# Patient Record
Sex: Female | Born: 1970 | Race: White | Hispanic: No | Marital: Married | Smoking: Never smoker
Health system: Southern US, Community
[De-identification: ages and names within clinical notes are randomized; demographics above are authoritative.]

## PROBLEM LIST (undated history)

## (undated) DIAGNOSIS — E669 Obesity, unspecified: Secondary | ICD-10-CM

## (undated) HISTORY — PX: TONSILLECTOMY: SUR1361

---

## 2007-07-06 ENCOUNTER — Emergency Department: Payer: Self-pay | Admitting: Emergency Medicine

## 2012-02-14 ENCOUNTER — Ambulatory Visit: Payer: Self-pay | Admitting: Family Medicine

## 2013-05-18 ENCOUNTER — Emergency Department: Payer: Self-pay | Admitting: Emergency Medicine

## 2013-05-18 LAB — COMPREHENSIVE METABOLIC PANEL
Albumin: 3.5 g/dL (ref 3.4–5.0)
Alkaline Phosphatase: 111 U/L (ref 50–136)
Anion Gap: 7 (ref 7–16)
Bilirubin,Total: 0.4 mg/dL (ref 0.2–1.0)
Calcium, Total: 8.6 mg/dL (ref 8.5–10.1)
Chloride: 110 mmol/L — ABNORMAL HIGH (ref 98–107)
Co2: 22 mmol/L (ref 21–32)
EGFR (Non-African Amer.): >60
SGPT (ALT): 26 U/L (ref 12–78)
Sodium: 139 mmol/L (ref 136–145)

## 2013-05-18 LAB — URINALYSIS, COMPLETE
Bacteria: NONE SEEN
Glucose,UR: NEGATIVE mg/dL (ref 0–75)
Leukocyte Esterase: NEGATIVE
Ph: 5 (ref 4.5–8.0)
RBC,UR: <1 /HPF (ref 0–5)

## 2013-05-18 LAB — CBC
HCT: 42.1 % (ref 35.0–47.0)
HGB: 14.1 g/dL (ref 12.0–16.0)
MCH: 28.7 pg (ref 26.0–34.0)
MCHC: 33.6 g/dL (ref 32.0–36.0)
MCV: 85 fL (ref 80–100)
RBC: 4.92 10*6/uL (ref 3.80–5.20)
WBC: 10.4 10*3/uL (ref 3.6–11.0)

## 2013-05-18 LAB — TROPONIN I: Troponin-I: <0.02 ng/mL

## 2022-03-08 ENCOUNTER — Observation Stay
Admission: EM | Admit: 2022-03-08 | Discharge: 2022-03-12 | Disposition: A | Discharge status: Home / Self Care | Payer: Self-pay | Attending: Internal Medicine | Admitting: Internal Medicine

## 2022-03-08 ENCOUNTER — Encounter: Payer: Self-pay | Admitting: Emergency Medicine

## 2022-03-08 ENCOUNTER — Emergency Department: Payer: Self-pay

## 2022-03-08 ENCOUNTER — Other Ambulatory Visit: Payer: Self-pay

## 2022-03-08 DIAGNOSIS — K5080 Crohn's disease of both small and large intestine without complications: Principal | ICD-10-CM | POA: Insufficient documentation

## 2022-03-08 DIAGNOSIS — Z9889 Other specified postprocedural states: Secondary | ICD-10-CM | POA: Insufficient documentation

## 2022-03-08 DIAGNOSIS — K8020 Calculus of gallbladder without cholecystitis without obstruction: Secondary | ICD-10-CM | POA: Diagnosis present

## 2022-03-08 DIAGNOSIS — K8050 Calculus of bile duct without cholangitis or cholecystitis without obstruction: Secondary | ICD-10-CM | POA: Diagnosis present

## 2022-03-08 DIAGNOSIS — Z8719 Personal history of other diseases of the digestive system: Secondary | ICD-10-CM

## 2022-03-08 DIAGNOSIS — R1010 Upper abdominal pain, unspecified: Secondary | ICD-10-CM

## 2022-03-08 HISTORY — DX: Obesity, unspecified: E66.9

## 2022-03-08 HISTORY — DX: Morbid (severe) obesity due to excess calories: E66.01

## 2022-03-08 LAB — BASIC METABOLIC PANEL
Anion gap: 7 (ref 5–15)
BUN: 8 mg/dL (ref 6–20)
CO2: 25 mmol/L (ref 22–32)
Calcium: 9.1 mg/dL (ref 8.9–10.3)
Chloride: 107 mmol/L (ref 98–111)
Creatinine, Ser: 0.79 mg/dL (ref 0.44–1.00)
GFR, Estimated: >60 mL/min (ref >60)
Glucose, Bld: 113 mg/dL — ABNORMAL HIGH (ref 70–99)
Potassium: 3.9 mmol/L (ref 3.5–5.1)
Sodium: 139 mmol/L (ref 135–145)

## 2022-03-08 LAB — HEPATIC FUNCTION PANEL
ALT: 51 U/L — ABNORMAL HIGH (ref 0–44)
AST: 95 U/L — ABNORMAL HIGH (ref 15–41)
Albumin: 4.2 g/dL (ref 3.5–5.0)
Alkaline Phosphatase: 138 U/L — ABNORMAL HIGH (ref 38–126)
Bilirubin, Direct: 0.4 mg/dL — ABNORMAL HIGH (ref 0.0–0.2)
Indirect Bilirubin: 0.5 mg/dL (ref 0.3–0.9)
Total Bilirubin: 0.9 mg/dL (ref 0.3–1.2)
Total Protein: 8.1 g/dL (ref 6.5–8.1)

## 2022-03-08 LAB — CBC
HCT: 43.4 % (ref 36.0–46.0)
Hemoglobin: 13.6 g/dL (ref 12.0–15.0)
MCH: 26.1 pg (ref 26.0–34.0)
MCHC: 31.3 g/dL (ref 30.0–36.0)
MCV: 83.1 fL (ref 80.0–100.0)
Platelets: 335 10*3/uL (ref 150–400)
RBC: 5.22 MIL/uL — ABNORMAL HIGH (ref 3.87–5.11)
RDW: 14.8 % (ref 11.5–15.5)
WBC: 9 10*3/uL (ref 4.0–10.5)
nRBC: 0 % (ref 0.0–0.2)

## 2022-03-08 LAB — TROPONIN I (HIGH SENSITIVITY): Troponin I (High Sensitivity): 4 ng/L (ref <18)

## 2022-03-08 LAB — LIPASE, BLOOD: Lipase: 41 U/L (ref 11–51)

## 2022-03-08 MED ORDER — HYDROMORPHONE HCL 1 MG/ML IJ SOLN
1.0000 mg | Freq: Once | INTRAMUSCULAR | Status: AC
  Administered 2022-03-08: 1 mg via INTRAVENOUS
  Filled 2022-03-08: qty 1

## 2022-03-08 MED ORDER — ONDANSETRON HCL 4 MG/2ML IJ SOLN
4.0000 mg | Freq: Once | INTRAMUSCULAR | Status: AC
  Administered 2022-03-08: 4 mg via INTRAVENOUS
  Filled 2022-03-08: qty 2

## 2022-03-08 MED ORDER — SODIUM CHLORIDE 0.9 % IV BOLUS (SEPSIS)
1000.0000 mL | Freq: Once | INTRAVENOUS | Status: AC
Start: 2022-03-08 — End: 2022-03-09
  Administered 2022-03-08: 1000 mL via INTRAVENOUS

## 2022-03-08 NOTE — ED Triage Notes (Signed)
Midsternal chest pain, onset apx 2 hours ago. Initially thought it may be gas but pain has not gotten any better. Pain is cramping in nature and radiates to back. Pain worsens with deep inspiration.  ?

## 2022-03-08 NOTE — ED Provider Notes (Addendum)
? ?[facility] ?Provider Note ? ? ? Event Date/Time  ? First MD Initiated Contact with Patient 03/08/22 2259   ?  (approximate) ? ? ?History  ? ?Chest Pain ? ? ?HPI ? ?Candice Garcia is a 51 y.o. female with history of obesity who presents to the emergency department with upper abdominal pain that radiates into the right flank that started around 7:30 PM tonight.  States that initially she thought it was gas and took Gas-X and another over-the-counter acid reducing medication without any relief.  She has never had similar symptoms.  She states it makes her feel short of breath because she feels she cannot take a deep breath without causing more pain.  She has nausea without vomiting.  Still has her gallbladder and denies any known history of gallstones.  States that she did not eat dinner tonight but ate half a cheeseburger for lunch. ? ?No history of PE, DVT, exogenous estrogen use, recent fractures, surgery, trauma, hospitalization, prolonged travel or other immobilization. No lower extremity swelling or pain. No calf tenderness. ? ? ? ?History provided by patient and husband. ? ? ? ?Past Medical History:  ?Diagnosis Date  ? Obesity   ? ? ?History reviewed. No pertinent surgical history. ? ?MEDICATIONS:  ?Prior to Admission medications   ?Not on File  ? ? ?Physical Exam  ? ?Triage Vital Signs: ?ED Triage Vitals  ?Enc Vitals Group  ?   BP 03/08/22 2155 138/83  ?   Pulse Rate 03/08/22 2155 85  ?   Resp 03/08/22 2155 19  ?   Temp 03/08/22 2155 98.3 ?F (36.8 ?C)  ?   Temp Source 03/08/22 2155 Oral  ?   SpO2 03/08/22 2155 96 %  ?   Weight 03/08/22 2151 250 lb (113.4 kg)  ?   Height 03/08/22 2151 5' 3"  (1.6 m)  ?   Head Circumference --   ?   Peak Flow --   ?   Pain Score 03/08/22 2151 9  ?   Pain Loc --   ?   Pain Edu? --   ?   Excl. in [place]? --   ? ? ?Most recent vital signs: ?Vitals:  ? 03/09/22 0439 03/09/22 0445  ?BP: 124/90   ?Pulse: 86 94  ?Resp: 13 17  ?Temp:    ?SpO2: 99% 96%   ? ? ?CONSTITUTIONAL: Alert and oriented and responds appropriately to questions.  Appears uncomfortable. ?HEAD: Normocephalic, atraumatic ?EYES: Conjunctivae clear, pupils appear equal, sclera nonicteric ?ENT: normal nose; moist mucous membranes ?NECK: Supple, normal ROM ?CARD: RRR; S1 and S2 appreciated; no murmurs, no clicks, no rubs, no gallops ?RESP: Normal chest excursion without splinting or tachypnea; breath sounds clear and equal bilaterally; no wheezes, no rhonchi, no rales, no hypoxia or respiratory distress, speaking full sentences ?ABD/GI: Normal bowel sounds; non-distended; soft, tender to palpation in the epigastric region and right upper quadrant with voluntary guarding, positive Murphy sign ?BACK: The back appears normal ?EXT: Normal ROM in all joints; no deformity noted, no edema; no cyanosis, no calf tenderness or calf swelling ?SKIN: Normal color for age and race; warm; no rash on exposed skin ?NEURO: Moves all extremities equally, normal speech ?PSYCH: The patient's mood and manner are appropriate. ? ? ?ED Results / Procedures / Treatments  ? ?LABS: ?(all labs ordered are listed, but only abnormal results are displayed) ?Labs Reviewed  ?BASIC METABOLIC PANEL - Abnormal; Notable for the following components:  ?    Result  Value  ? Glucose, Bld 113 (*)   ? All other components within normal limits  ?CBC - Abnormal; Notable for the following components:  ? RBC 5.22 (*)   ? All other components within normal limits  ?HEPATIC FUNCTION PANEL - Abnormal; Notable for the following components:  ? AST 95 (*)   ? ALT 51 (*)   ? Alkaline Phosphatase 138 (*)   ? Bilirubin, Direct 0.4 (*)   ? All other components within normal limits  ?LIPASE, BLOOD  ?TROPONIN I (HIGH SENSITIVITY)  ?TROPONIN I (HIGH SENSITIVITY)  ? ? ? ?EKG: ? EKG Interpretation ? ?Date/Time:  Tuesday March 08 2022 21:53:34 EDT ?Ventricular Rate:  89 ?PR Interval:  182 ?QRS Duration: 80 ?QT Interval:  374 ?QTC Calculation: 455 ?R  Axis:   61 ?Text Interpretation: Normal sinus rhythm Normal ECG When compared with ECG of 18-May-2013 22:33, No significant change was found Confirmed by Pryor Curia [phone]) on 03/08/2022 11:02:50 PM ?  ? ?  ? ? ? ?RADIOLOGY: ?My personal review and interpretation of imaging: Chest x-ray shows no pneumonia, edema.  Right upper quad ultrasound shows cholelithiasis. ? ?I have personally reviewed all radiology reports.   ?DG Chest 2 View ? ?Result Date: 03/08/2022 ?CLINICAL DATA:  Midsternal chest pain for 2 hours EXAM: CHEST - 2 VIEW COMPARISON:  None. FINDINGS: The heart size and mediastinal contours are within normal limits. Both lungs are clear. The visualized skeletal structures are unremarkable. IMPRESSION: No active cardiopulmonary disease. Electronically Signed   By: Randa Ngo M.D.   On: 03/08/2022 22:13  ? ?MR ABDOMEN MRCP WO CONTRAST ? ?Result Date: 03/09/2022 ?CLINICAL DATA:  Cholelithiasis.  Upper abdominal pain. EXAM: MRI ABDOMEN WITHOUT CONTRAST  (INCLUDING MRCP) TECHNIQUE: Multiplanar multisequence MR imaging of the abdomen was performed. Heavily T2-weighted images of the biliary and pancreatic ducts were obtained, and three-dimensional MRCP images were rendered by post processing. COMPARISON:  Ultrasound exam earlier same day FINDINGS: Lower chest: Basilar atelectasis noted bilaterally. Hepatobiliary: No suspicious focal abnormality in the liver on this study without intravenous contrast. Fine detail of liver parenchyma obscured by breathing motion. Multiple gallstones are evident ranging in size from 1-2 mm up to a dominant 17 mm stone. No evidence for gallbladder wall thickening or pericholecystic fluid. No intra or extrahepatic biliary duct dilatation with common bile duct measuring 6 mm diameter in the head of the pancreas. 1-2 mm stone is identified in the distal common bile duct (well seen on coronal haste image 16 of series 3 and coronal thin MIP image 5 of series 15). Pancreas: No focal mass  lesion. No dilatation of the main duct. No intraparenchymal cyst. No peripancreatic edema. Spleen:  No splenomegaly. No focal mass lesion. Adrenals/Urinary Tract: No adrenal nodule or mass. Kidneys unremarkable. Stomach/Bowel: Stomach is unremarkable. No gastric wall thickening. No evidence of outlet obstruction. Duodenum is normally positioned as is the ligament of Treitz. No small bowel or colonic dilatation within the visualized abdomen. Vascular/Lymphatic: No abdominal aortic aneurysm. No abdominal lymphadenopathy. Other:  No intraperitoneal free fluid. Musculoskeletal: No suspicious abnormal marrow signal evident. IMPRESSION: Cholelithiasis with choledocholithiasis. No intra or extrahepatic biliary duct dilatation. Electronically Signed   By: Misty Stanley M.D.   On: 03/09/2022 05:07  ? ?US ABDOMEN LIMITED RUQ (LIVER/GB) ? ?Result Date: 03/09/2022 ?CLINICAL DATA:  Right upper quadrant abdominal pain. EXAM: ULTRASOUND ABDOMEN LIMITED RIGHT UPPER QUADRANT COMPARISON:  None. FINDINGS: Gallbladder: There multiple stones within the gallbladder. No gallbladder wall thickening or pericholecystic fluid. Negative  sonographic Murphy's sign. Common bile duct: Diameter: 3 mm Liver: No focal lesion identified. Within normal limits in parenchymal echogenicity. Portal vein is patent on color Doppler imaging with normal direction of blood flow towards the liver. Other: None. IMPRESSION: Cholelithiasis without sonographic evidence of acute cholecystitis. Electronically Signed   By: Anner Crete M.D.   On: 03/09/2022 02:23   ? ? ?PROCEDURES: ? ?Critical Care performed: No ? ? ?CRITICAL CARE ?Performed by: Cyril Mourning Aashvi Rezabek ? ? ?Total critical care time: 0 minutes ? ?Critical care time was exclusive of separately billable procedures and treating other patients. ? ?Critical care was necessary to treat or prevent imminent or life-threatening deterioration. ? ?Critical care was time spent personally by me on the following activities:  development of treatment plan with patient and/or surrogate as well as nursing, discussions with consultants, evaluation of patient's response to treatment, examination of patient, obtaining history from patient or surrogate,

## 2022-03-09 ENCOUNTER — Encounter: Payer: Self-pay | Admitting: Internal Medicine

## 2022-03-09 ENCOUNTER — Emergency Department: Payer: Self-pay

## 2022-03-09 DIAGNOSIS — Z6841 Body Mass Index (BMI) 40.0 and over, adult: Secondary | ICD-10-CM

## 2022-03-09 DIAGNOSIS — K8020 Calculus of gallbladder without cholecystitis without obstruction: Secondary | ICD-10-CM | POA: Diagnosis present

## 2022-03-09 DIAGNOSIS — K8050 Calculus of bile duct without cholangitis or cholecystitis without obstruction: Secondary | ICD-10-CM | POA: Diagnosis present

## 2022-03-09 DIAGNOSIS — K8070 Calculus of gallbladder and bile duct without cholecystitis without obstruction: Secondary | ICD-10-CM

## 2022-03-09 LAB — APTT: aPTT: 27 seconds (ref 24–36)

## 2022-03-09 LAB — TROPONIN I (HIGH SENSITIVITY): Troponin I (High Sensitivity): 5 ng/L (ref <18)

## 2022-03-09 LAB — PROTIME-INR
INR: 1 (ref 0.8–1.2)
Prothrombin Time: 13.4 seconds (ref 11.4–15.2)

## 2022-03-09 LAB — PREGNANCY, URINE: Preg Test, Ur: NEGATIVE

## 2022-03-09 MED ORDER — IBUPROFEN 400 MG PO TABS
400.0000 mg | ORAL_TABLET | Freq: Four times a day (QID) | ORAL | Status: DC | PRN
Start: 2022-03-09 — End: 2022-03-11
  Administered 2022-03-09 – 2022-03-10 (×3): 400 mg via ORAL
  Filled 2022-03-09 (×3): qty 1

## 2022-03-09 MED ORDER — PANTOPRAZOLE SODIUM 40 MG IV SOLR
40.0000 mg | Freq: Once | INTRAVENOUS | Status: AC
  Administered 2022-03-09: 40 mg via INTRAVENOUS
  Filled 2022-03-09: qty 10

## 2022-03-09 MED ORDER — SODIUM CHLORIDE 0.9 % IV SOLN: INTRAVENOUS | Status: DC

## 2022-03-09 MED ORDER — HYDROMORPHONE HCL 1 MG/ML IJ SOLN
1.0000 mg | Freq: Once | INTRAMUSCULAR | Status: AC
  Administered 2022-03-09: 1 mg via INTRAVENOUS
  Filled 2022-03-09: qty 1

## 2022-03-09 MED ORDER — HYDROMORPHONE HCL 1 MG/ML IJ SOLN
1.0000 mg | INTRAMUSCULAR | Status: DC | PRN
  Administered 2022-03-10: 1 mg via INTRAVENOUS
  Filled 2022-03-09: qty 1

## 2022-03-09 MED ORDER — LACTATED RINGERS IV SOLN: INTRAVENOUS | Status: DC

## 2022-03-09 MED ORDER — MORPHINE SULFATE (PF) 2 MG/ML IV SOLN
2.0000 mg | INTRAVENOUS | Status: DC | PRN
  Administered 2022-03-09 (×2): 2 mg via INTRAVENOUS
  Filled 2022-03-09 (×2): qty 1

## 2022-03-09 MED ORDER — PANTOPRAZOLE SODIUM 40 MG PO TBEC
40.0000 mg | DELAYED_RELEASE_TABLET | Freq: Every day | ORAL | Status: DC
  Administered 2022-03-09 – 2022-03-10 (×2): 40 mg via ORAL
  Filled 2022-03-09 (×3): qty 1

## 2022-03-09 MED ORDER — SODIUM CHLORIDE 0.9 % IV SOLN
2.0000 g | Freq: Every day | INTRAVENOUS | Status: DC
  Administered 2022-03-09 – 2022-03-10 (×2): 2 g via INTRAVENOUS
  Filled 2022-03-09 (×2): qty 20
  Filled 2022-03-09: qty 2

## 2022-03-09 MED ORDER — OXYCODONE HCL 5 MG PO TABS
5.0000 mg | ORAL_TABLET | ORAL | Status: DC | PRN
  Administered 2022-03-09 – 2022-03-11 (×6): 5 mg via ORAL
  Filled 2022-03-09 (×8): qty 1

## 2022-03-09 MED ORDER — ONDANSETRON HCL 4 MG/2ML IJ SOLN
4.0000 mg | Freq: Once | INTRAMUSCULAR | Status: AC
  Administered 2022-03-09: 4 mg via INTRAVENOUS
  Filled 2022-03-09: qty 2

## 2022-03-09 MED ORDER — METOCLOPRAMIDE HCL 5 MG/ML IJ SOLN
10.0000 mg | Freq: Once | INTRAMUSCULAR | Status: AC
  Administered 2022-03-09: 10 mg via INTRAVENOUS
  Filled 2022-03-09: qty 2

## 2022-03-09 MED ORDER — ONDANSETRON HCL 4 MG/2ML IJ SOLN
4.0000 mg | Freq: Four times a day (QID) | INTRAMUSCULAR | Status: DC | PRN
  Administered 2022-03-09 – 2022-03-11 (×5): 4 mg via INTRAVENOUS
  Filled 2022-03-09 (×5): qty 2

## 2022-03-09 NOTE — ED Notes (Signed)
Pt up to bathroom and back to bed; pt c/o nausea, EDP made aware ?

## 2022-03-09 NOTE — Progress Notes (Signed)
Persistent intractable pain > 8 hrs c/w cholecystitis. We will admit. Depending on repeat LFTs may need MRCP. Cholecystectomy during admission pending OR availability ?

## 2022-03-09 NOTE — Consult Note (Signed)
?  ?Jonathon Bellows , MD ?[address] [place], [place], [place], [address] ?[address] [place], [place], [place], [address] ?Phone: [phone]  ?Fax: [phone] ? Consultation ? ?Referring Provider:     Dr Blaine Hamper ?Primary Care Physician:  Pcp, No ?Primary Gastroenterologist:  none          ?Reason for Consultation:     Choledocholithiasis ? ?Date of Admission:  03/08/2022 ?Date of Consultation:  03/09/2022 ?       ? HPI:   ?Candice Garcia is a 51 y.o. female Presented to the ER with upper abdominal pain radiating to the right side since she had her breakfast yesterday.  Still has ongoing pain but better with pain medications.  No similar issue in the past but over the last few days she has been having some issues with nausea.  Family history of gallstones.  No nausea vomiting at this point of time.  Denies any fevers.  On admission AST was 95, ALT 51 alkaline phosphatase 138.  And total bilirubin was normal at 0.9.  Underwent an MRCP that showed cholelithiasis with choledocholithiasis with no intra or extrahepatic biliary dilation.  Ultrasound right upper quadrant demonstrated cholelithiasis without evidence of acute cholecystitis.  I have been consulted for an ERCP.Lipase was not elevated and hemoglobin 13.6 g on admission. ? ?Past Medical History:  ?Diagnosis Date  ? Morbid obesity with body mass index (BMI) of 40.0 to 44.9 in adult [facility])   ? ? ?History reviewed. No pertinent surgical history. ? ?Prior to Admission medications   ?Not on File  ? ? ?History reviewed. No pertinent family history.  ? ?  ? ?Allergies as of 03/08/2022  ? (No Known Allergies)  ? ? ?Review of Systems:    ?All systems reviewed and negative except where noted in HPI. ? ? Physical Exam:  ?Vital signs in last 24 hours: ?Temp:  [98.3 ?F (36.8 ?C)] 98.3 ?F (36.8 ?C) (03/28 2155) ?Pulse Rate:  [74-94] 88 (03/29 0530) ?Resp:  [12-30] 16 (03/29 0530) ?BP: (107-138)/(77-96) 130/85 (03/29 0530) ?SpO2:  [90 %-99 %] 90 % (03/29 0530) ?Weight:   [113.4 kg] 113.4 kg (03/28 2151) ?  ?General:   Pleasant, cooperative in NAD ?Head:  Normocephalic and atraumatic. ?Eyes:   No icterus.   Conjunctiva pink. PERRLA. ?Ears:  Normal auditory acuity. ?Neck:  Supple; no masses or thyroidomegaly ?Lungs: Respirations even and unlabored. Lungs clear to auscultation bilaterally.   No wheezes, crackles, or rhonchi.  ?Heart:  Regular rate and rhythm;  Without murmur, clicks, rubs or gallops ?Abdomen:  Soft, nondistended, mild epigastric and right upper quadrant tenderness.  Normal bowel sounds. No appreciable masses or hepatomegaly.  No rebound or guarding.  ?Neurologic:  Alert and oriented x3;  grossly normal neurologically. ?Psych:  Alert and cooperative. Normal affect. ? ?LAB RESULTS: ?Recent Labs  ?  03/08/22 ?2155  ?WBC 9.0  ?HGB 13.6  ?HCT 43.4  ?PLT 335  ? ?BMET ?Recent Labs  ?  03/08/22 ?2155  ?NA 139  ?K 3.9  ?CL 107  ?CO2 25  ?GLUCOSE 113*  ?BUN 8  ?CREATININE 0.79  ?CALCIUM 9.1  ? ?LFT ?Recent Labs  ?  03/08/22 ?2155  ?PROT 8.1  ?ALBUMIN 4.2  ?AST 95*  ?ALT 51*  ?ALKPHOS 138*  ?BILITOT 0.9  ?BILIDIR 0.4*  ?IBILI 0.5  ? ?PT/INR ?No results for input(s): LABPROT, INR in the last 72 hours. ? ?STUDIES: ?DG Chest 2 View ? ?Result Date: 03/08/2022 ?CLINICAL DATA:  Midsternal  chest pain for 2 hours EXAM: CHEST - 2 VIEW COMPARISON:  None. FINDINGS: The heart size and mediastinal contours are within normal limits. Both lungs are clear. The visualized skeletal structures are unremarkable. IMPRESSION: No active cardiopulmonary disease. Electronically Signed   By: Randa Ngo M.D.   On: 03/08/2022 22:13  ? ?MR ABDOMEN MRCP WO CONTRAST ? ?Result Date: 03/09/2022 ?CLINICAL DATA:  Cholelithiasis.  Upper abdominal pain. EXAM: MRI ABDOMEN WITHOUT CONTRAST  (INCLUDING MRCP) TECHNIQUE: Multiplanar multisequence MR imaging of the abdomen was performed. Heavily T2-weighted images of the biliary and pancreatic ducts were obtained, and three-dimensional MRCP images were rendered by post  processing. COMPARISON:  Ultrasound exam earlier same day FINDINGS: Lower chest: Basilar atelectasis noted bilaterally. Hepatobiliary: No suspicious focal abnormality in the liver on this study without intravenous contrast. Fine detail of liver parenchyma obscured by breathing motion. Multiple gallstones are evident ranging in size from 1-2 mm up to a dominant 17 mm stone. No evidence for gallbladder wall thickening or pericholecystic fluid. No intra or extrahepatic biliary duct dilatation with common bile duct measuring 6 mm diameter in the head of the pancreas. 1-2 mm stone is identified in the distal common bile duct (well seen on coronal haste image 16 of series 3 and coronal thin MIP image 5 of series 15). Pancreas: No focal mass lesion. No dilatation of the main duct. No intraparenchymal cyst. No peripancreatic edema. Spleen:  No splenomegaly. No focal mass lesion. Adrenals/Urinary Tract: No adrenal nodule or mass. Kidneys unremarkable. Stomach/Bowel: Stomach is unremarkable. No gastric wall thickening. No evidence of outlet obstruction. Duodenum is normally positioned as is the ligament of Treitz. No small bowel or colonic dilatation within the visualized abdomen. Vascular/Lymphatic: No abdominal aortic aneurysm. No abdominal lymphadenopathy. Other:  No intraperitoneal free fluid. Musculoskeletal: No suspicious abnormal marrow signal evident. IMPRESSION: Cholelithiasis with choledocholithiasis. No intra or extrahepatic biliary duct dilatation. Electronically Signed   By: Misty Stanley M.D.   On: 03/09/2022 05:07  ? ?US ABDOMEN LIMITED RUQ (LIVER/GB) ? ?Result Date: 03/09/2022 ?CLINICAL DATA:  Right upper quadrant abdominal pain. EXAM: ULTRASOUND ABDOMEN LIMITED RIGHT UPPER QUADRANT COMPARISON:  None. FINDINGS: Gallbladder: There multiple stones within the gallbladder. No gallbladder wall thickening or pericholecystic fluid. Negative sonographic Murphy's sign. Common bile duct: Diameter: 3 mm Liver: No focal  lesion identified. Within normal limits in parenchymal echogenicity. Portal vein is patent on color Doppler imaging with normal direction of blood flow towards the liver. Other: None. IMPRESSION: Cholelithiasis without sonographic evidence of acute cholecystitis. Electronically Signed   By: Anner Crete M.D.   On: 03/09/2022 02:23   ? ? ? Impression / Plan:  ? ?Candice Garcia is a 51 y.o. y/o female comes to the emergency room with biliary colic and was found to have choledocholithiasis on MRCP. ? ?Plan ?1.  We will plan for ERCP tomorrow with Dr.Wohl ? ? ?I have discussed alternative options, risks & benefits,  which include, but are not limited to, bleeding, infection, acute pancreatitis, perforation,respiratory complication & drug reaction.  The patient agrees with this plan & written consent will be obtained.   ? ? ?Thank you for involving me in the care of this patient.   ? ? ? LOS: 0 days  ? ?Jonathon Bellows, MD  03/09/2022, 8:21 AM ? ?   ?

## 2022-03-09 NOTE — ED Notes (Signed)
Patient transported to MRI 

## 2022-03-09 NOTE — Consult Note (Signed)
[facility] ?SURGICAL CONSULTATION NOTE (initial) - cpt: [phone] ? ? ?HISTORY OF PRESENT ILLNESS (HPI):  ?51 y.o. female presented to [facility] ED overnight for abdominal pain. Patient reports that acute onset of RUQ abdominal pain around 1900 last night. This was sharp in nature and radiated to her flank and back. She did not get any relief from this. She reports deep inspiration resulted in exacerbation of the pain. She endorses associated nausea. No fever, chills, cough, CP, SOB, emesis, urinary changes, bowel changes, or juandice. She does not believe she has had any similar pain in the past. Thinks she may have had less severe episodes before but attributed it to mild indigestion. Only previous abdominal surgery is C-section x1. Work up in the ED was relatively reassuring aside from LFT elevation. She did undergo [place] which was concerning for cholelithiasis without gross evidence of cholecystitis. She would ultimately undergo MRCP which was concerning for choledocholithiasis. She was admitted to medicine service.  ? ?Surgery is consulted by emergency medicine physician Dr. Pryor Curia, DO in this context for evaluation and management of choledocholithiasis. ? ?PAST MEDICAL HISTORY (PMH):  ?Past Medical History:  ?Diagnosis Date  ? Obesity   ?  ? ?PAST SURGICAL HISTORY ([place]):  ?History reviewed. No pertinent surgical history.  ? ?MEDICATIONS:  ?Prior to Admission medications   ?Not on File  ?  ? ?ALLERGIES:  ?No Known Allergies  ? ?SOCIAL HISTORY:  ?Social History  ? ?Socioeconomic History  ? Marital status: Married  ?  Spouse name: Not on file  ? Number of children: Not on file  ? Years of education: Not on file  ? Highest education level: Not on file  ?Occupational History  ? Not on file  ?Tobacco Use  ? Smoking status: Not on file  ? Smokeless tobacco: Not on file  ?Substance and Sexual Activity  ? Alcohol use: Not on file  ? Drug use: Not on file  ? Sexual activity: Not on file  ?Other Topics  Concern  ? Not on file  ?Social History Narrative  ? Not on file  ? ?Social Determinants of Health  ? ?Financial Resource Strain: Not on file  ?Food Insecurity: Not on file  ?Transportation Needs: Not on file  ?Physical Activity: Not on file  ?Stress: Not on file  ?Social Connections: Not on file  ?Intimate Partner Violence: Not on file  ?  ? ?FAMILY HISTORY:  ?History reviewed. No pertinent family history.  ? ? ?REVIEW OF SYSTEMS:  ?Review of Systems  ?Constitutional:  Negative for chills and fever.  ?HENT:  Negative for congestion and sore throat.   ?Respiratory:  Negative for cough and shortness of breath.   ?Cardiovascular:  Negative for chest pain and palpitations.  ?Gastrointestinal:  Positive for abdominal pain and nausea. Negative for blood in stool, constipation, diarrhea and vomiting.  ?Genitourinary:  Negative for dysuria and urgency.  ?All other systems reviewed and are negative. ? ?VITAL SIGNS:  ?Temp:  [98.3 ?F (36.8 ?C)] 98.3 ?F (36.8 ?C) (03/28 2155) ?Pulse Rate:  [74-94] 88 (03/29 0530) ?Resp:  [12-30] 16 (03/29 0530) ?BP: (107-138)/(77-96) 130/85 (03/29 0530) ?SpO2:  [90 %-99 %] 90 % (03/29 0530) ?Weight:  [113.4 kg] 113.4 kg (03/28 2151)     Height: 5' 3"  (160 cm) Weight: 113.4 kg BMI (Calculated): 44.3  ? ?INTAKE/OUTPUT:  ?03/28 0701 - 03/29 0700 ?In: 1000 [IV Piggyback:1000] ?Out: -  ? ?PHYSICAL EXAM:  ?Physical Exam ?Vitals and nursing note reviewed. Exam conducted with  a chaperone present.  ?Constitutional:   ?   General: She is not in acute distress. ?   Appearance: Normal appearance. She is obese. She is not ill-appearing.  ?HENT:  ?   Head: Normocephalic and atraumatic.  ?   Mouth/Throat:  ?   Mouth: Mucous membranes are moist.  ?   Pharynx: Oropharynx is clear.  ?Eyes:  ?   General: No scleral icterus. ?   Conjunctiva/sclera: Conjunctivae normal.  ?Cardiovascular:  ?   Rate and Rhythm: Normal rate.  ?   Pulses: Normal pulses.  ?   Heart sounds: No murmur heard. ?Pulmonary:  ?   Effort:  Pulmonary effort is normal. No respiratory distress.  ?   Breath sounds: Normal breath sounds.  ?Abdominal:  ?   General: Abdomen is protuberant. A surgical scar is present. There is no distension.  ?   Palpations: Abdomen is soft.  ?   Tenderness: There is abdominal tenderness in the right upper quadrant and epigastric area. There is no guarding or rebound. Negative signs include Murphy's sign.  ?   Comments: Abdomen is obese, soft, she is mildly tender in epigastrium and RUQ, non-distended, no rebound/guarding. She is without positive Murphy's Sign.   ?Genitourinary: ?   Comments: Deferred ?Musculoskeletal:  ?   Right lower leg: No edema.  ?   Left lower leg: No edema.  ?Skin: ?   General: Skin is warm and dry.  ?   Coloration: Skin is not jaundiced.  ?   Findings: No erythema.  ?Neurological:  ?   General: No focal deficit present.  ?   Mental Status: She is alert and oriented to person, place, and time.  ?Psychiatric:     ?   Mood and Affect: Mood normal.     ?   Behavior: Behavior normal.  ? ? ? ?Labs:  ? ?  Latest Ref Rng & Units 03/08/2022  ?  9:55 PM 05/18/2013  ? 10:28 PM  ?CBC  ?WBC 4.0 - 10.5 K/uL 9.0   10.4    ?Hemoglobin 12.0 - 15.0 g/dL 13.6   14.1    ?Hematocrit 36.0 - 46.0 % 43.4   42.1    ?Platelets 150 - 400 K/uL 335   298    ? ? ?  Latest Ref Rng & Units 03/08/2022  ?  9:55 PM 05/18/2013  ? 10:28 PM  ?CMP  ?Glucose 70 - 99 mg/dL 113   101    ?BUN 6 - 20 mg/dL 8   7    ?Creatinine 0.44 - 1.00 mg/dL 0.79   0.74    ?Sodium 135 - 145 mmol/L 139   139    ?Potassium 3.5 - 5.1 mmol/L 3.9   3.6    ?Chloride 98 - 111 mmol/L 107   110    ?CO2 22 - 32 mmol/L 25   22    ?Calcium 8.9 - 10.3 mg/dL 9.1   8.6    ?Total Protein 6.5 - 8.1 g/dL 8.1   7.4    ?Total Bilirubin 0.3 - 1.2 mg/dL 0.9   0.4    ?Alkaline Phos 38 - 126 U/L 138   111    ?AST 15 - 41 U/L 95   23    ?ALT 0 - 44 U/L 51   26    ? ? ? ?Imaging studies:  ? ?RUQ [place] (03/09/2022) personally reviewed showing significant cholelithiasis, no evidence of  cholecystitis, and radiologist report reviewed below:  ?IMPRESSION: ?Cholelithiasis  without sonographic evidence of acute cholecystitis. ? ? ?MRCP (03/09/2022) personally reviewed which is positive for CBD filling defect consistent with choledocholithiasis, and radiologist report reviewed below:  ?IMPRESSION: ?Cholelithiasis with choledocholithiasis. No intra or extrahepatic ?biliary duct dilatation. ? ? ?Assessment/Plan: (ICD-10's: K80.50) ?51 y.o. female with choledocholithiasis ? ? - Appreciate medicine admission ?- Agree with GI consultation for ERCP  ?- She will benefit from cholecystectomy this admission after ERCP. Anticipate this will be Thursday or Friday with Dr Dahlia Byes pending OR/Anesthesia availability   ? - NPO for potential GI procedure today ?- IVF resuscitation ?- Monitor abdominal examination ?- Pain control prn; antiemetics prn ?  - Monitor LFTs ? - Mobilization as tolerated  ? - Further management per primary service; we will follow  ? ?All of the above findings and recommendations were discussed with the patient, and all of patient's questions were answered to her expressed satisfaction. ? ?Thank you for the opportunity to participate in this patient's care.  ? ?-- ?Edison Simon, PA-C ?[facility] ?03/09/2022, 7:14 AM ?[phone] ?M-F: 7am - 4pm ? ?

## 2022-03-09 NOTE — Progress Notes (Signed)
Admission profile updated. ?

## 2022-03-09 NOTE — H&P (Signed)
?History and Physical  ? ? ?Candice Garcia HYI:[id number] DOB: December 29, 1970 DOA: 03/08/2022 ? ?Referring MD/NP/PA:  ? ?PCP: Pcp, No  ? ?Patient coming from:  The patient is coming from home.  At baseline, pt is independent for most of ADL.       ? ?Chief Complaint: abdominal pain ? ?HPI: Candice Garcia is a 51 y.o. female with medical history significant of obesity with BMI 44.29, who presents with abdominal pain. ? ?Patient states that her abdominal pain started at about 7:30 PM yesterday, which is located in the right upper quadrant and epigastric area, sharp, severe, aggravated by deep breath.  She also has some back pain, does not think this is radiated from abdominal pain.  Patient has nausea, no vomiting or diarrhea.  No fever or chills.  Patient does not have chest pain, cough, shortness of breath.  No symptoms of UTI. ? ? ?Data Reviewed and ED Course: pt was found to have WBC 9.0, troponin level 4 --> 5, lipase 41, liver function (ALP 138, AST 95, ALT 51, total bilirubin 0.4), temperature normal, blood pressure 130/85, heart rate 94, 88, RR 30, 16, oxygen saturation 90-99% on room air.  Chest x-ray negative.  RUQ-ultrasound showed cholelithiasis without signs of cholecystitis.  MRCP showed cholelithiasis with choledocholithiasis without ductal dilation.  Patient is admitted to [facility] bed as inpatient.  Dr. Vicente Males of GI and Dr. Dahlia Byes of surgery are consulted. ? ? ?EKG: I have personally reviewed.  Sinus rhythm, QTc 455, low voltage in lead III/V2.  No ischemic change. ? ? ?Review of Systems:  ? ?General: no fevers, chills, no body weight gain, has fatigue ?HEENT: no blurry vision, hearing changes or sore throat ?Respiratory: no dyspnea, coughing, wheezing ?CV: no chest pain, no palpitations ?GI: has nausea, abdominal pain, no diarrhea, constipation, vomiting,  ?GU: no dysuria, burning on urination, increased urinary frequency, hematuria  ?Ext: no leg edema ?Neuro: no unilateral weakness, numbness, or tingling,  no vision change or hearing loss ?Skin: no rash, no skin tear. ?MSK: No muscle spasm, no deformity, no limitation of range of movement in spin ?Heme: No easy bruising.  ?Travel history: No recent long distant travel. ? ? ?Allergy: No Known Allergies ? ?Past Medical History:  ?Diagnosis Date  ? Morbid obesity with body mass index (BMI) of 40.0 to 44.9 in adult [facility])   ? ? ?Past Surgical History:  ?Procedure Laterality Date  ? CESAREAN SECTION    ? TONSILLECTOMY    ? ? ?Social History:  reports that she has never smoked. She has never used smokeless tobacco. She reports that she does not currently use alcohol. She reports that she does not use drugs. ? ?Family History:  ?Family History  ?Problem Relation Age of Onset  ? Lung cancer Father   ? Heart disease Father   ? Heart disease Brother   ?  ? ?Prior to Admission medications   ?Not on File  ? ? ?Physical Exam: ?Vitals:  ? 03/09/22 0330 03/09/22 0439 03/09/22 0445 03/09/22 0530  ?BP: 136/86 124/90  130/85  ?Pulse: 74 86 94 88  ?Resp: 19 13 17 16   ?Temp:      ?TempSrc:      ?SpO2: 92% 99% 96% 90%  ?Weight:      ?Height:      ? ?General: Not in acute distress ?HEENT: ?      Eyes: PERRL, EOMI, no scleral icterus. ?      ENT: No discharge from the ears  and nose, no pharynx injection, no tonsillar enlargement.  ?      Neck: No JVD, no bruit, no mass felt. ?Heme: No neck lymph node enlargement. ?Cardiac: S1/S2, RRR, No murmurs, No gallops or rubs. ?Respiratory: No rales, wheezing, rhonchi or rubs. ?GI: Soft, nondistended, has tenderness in right upper quadrant, no rebound pain, no organomegaly, BS present. ?GU: No hematuria ?Ext: No pitting leg edema bilaterally. 1+DP/PT pulse bilaterally. ?Musculoskeletal: No joint deformities, No joint redness or warmth, no limitation of ROM in spin. ?Skin: No rashes.  ?Neuro: Alert, oriented X3, cranial nerves II-XII grossly intact, moves all extremities normally. ?Psych: Patient is not psychotic, no suicidal or hemocidal  ideation. ? ?Labs on Admission: I have personally reviewed following labs and imaging studies ? ?CBC: ?Recent Labs  ?Lab 03/08/22 ?2155  ?WBC 9.0  ?HGB 13.6  ?HCT 43.4  ?MCV 83.1  ?PLT 335  ? ?Basic Metabolic Panel: ?Recent Labs  ?Lab 03/08/22 ?2155  ?NA 139  ?K 3.9  ?CL 107  ?CO2 25  ?GLUCOSE 113*  ?BUN 8  ?CREATININE 0.79  ?CALCIUM 9.1  ? ?GFR: ?Estimated Creatinine Clearance: 100.9 mL/min (by C-G formula based on SCr of 0.79 mg/dL). ?Liver Function Tests: ?Recent Labs  ?Lab 03/08/22 ?2155  ?AST 95*  ?ALT 51*  ?ALKPHOS 138*  ?BILITOT 0.9  ?PROT 8.1  ?ALBUMIN 4.2  ? ?Recent Labs  ?Lab 03/08/22 ?2155  ?LIPASE 41  ? ?No results for input(s): AMMONIA in the last 168 hours. ?Coagulation Profile: ?No results for input(s): INR, PROTIME in the last 168 hours. ?Cardiac Enzymes: ?No results for input(s): CKTOTAL, CKMB, CKMBINDEX, TROPONINI in the last 168 hours. ?BNP (last 3 results) ?No results for input(s): PROBNP in the last 8760 hours. ?HbA1C: ?No results for input(s): HGBA1C in the last 72 hours. ?CBG: ?No results for input(s): GLUCAP in the last 168 hours. ?Lipid Profile: ?No results for input(s): CHOL, HDL, LDLCALC, TRIG, CHOLHDL, LDLDIRECT in the last 72 hours. ?Thyroid Function Tests: ?No results for input(s): TSH, T4TOTAL, FREET4, T3FREE, THYROIDAB in the last 72 hours. ?Anemia Panel: ?No results for input(s): VITAMINB12, FOLATE, FERRITIN, TIBC, IRON, RETICCTPCT in the last 72 hours. ?Urine analysis: ?   ?Component Value Date/Time  ? COLORURINE Straw 05/18/2013 2209  ? APPEARANCEUR Clear 05/18/2013 2209  ? LABSPEC 1.011 05/18/2013 2209  ? PHURINE 5.0 05/18/2013 2209  ? GLUCOSEU Negative 05/18/2013 2209  ? HGBUR 1+ 05/18/2013 2209  ? BILIRUBINUR Negative 05/18/2013 2209  ? KETONESUR Negative 05/18/2013 2209  ? PROTEINUR Negative 05/18/2013 2209  ? NITRITE Negative 05/18/2013 2209  ? LEUKOCYTESUR Negative 05/18/2013 2209  ? ?Sepsis Labs: ?@LABRCNTIP (procalcitonin:4,lacticidven:4) ?)No results found for this or any  previous visit (from the past 240 hour(s)).  ? ?Radiological Exams on Admission: ?DG Chest 2 View ? ?Result Date: 03/08/2022 ?CLINICAL DATA:  Midsternal chest pain for 2 hours EXAM: CHEST - 2 VIEW COMPARISON:  None. FINDINGS: The heart size and mediastinal contours are within normal limits. Both lungs are clear. The visualized skeletal structures are unremarkable. IMPRESSION: No active cardiopulmonary disease. Electronically Signed   By: Randa Ngo M.D.   On: 03/08/2022 22:13  ? ?MR ABDOMEN MRCP WO CONTRAST ? ?Result Date: 03/09/2022 ?CLINICAL DATA:  Cholelithiasis.  Upper abdominal pain. EXAM: MRI ABDOMEN WITHOUT CONTRAST  (INCLUDING MRCP) TECHNIQUE: Multiplanar multisequence MR imaging of the abdomen was performed. Heavily T2-weighted images of the biliary and pancreatic ducts were obtained, and three-dimensional MRCP images were rendered by post processing. COMPARISON:  Ultrasound exam earlier same day FINDINGS: Lower  chest: Basilar atelectasis noted bilaterally. Hepatobiliary: No suspicious focal abnormality in the liver on this study without intravenous contrast. Fine detail of liver parenchyma obscured by breathing motion. Multiple gallstones are evident ranging in size from 1-2 mm up to a dominant 17 mm stone. No evidence for gallbladder wall thickening or pericholecystic fluid. No intra or extrahepatic biliary duct dilatation with common bile duct measuring 6 mm diameter in the head of the pancreas. 1-2 mm stone is identified in the distal common bile duct (well seen on coronal haste image 16 of series 3 and coronal thin MIP image 5 of series 15). Pancreas: No focal mass lesion. No dilatation of the main duct. No intraparenchymal cyst. No peripancreatic edema. Spleen:  No splenomegaly. No focal mass lesion. Adrenals/Urinary Tract: No adrenal nodule or mass. Kidneys unremarkable. Stomach/Bowel: Stomach is unremarkable. No gastric wall thickening. No evidence of outlet obstruction. Duodenum is normally  positioned as is the ligament of Treitz. No small bowel or colonic dilatation within the visualized abdomen. Vascular/Lymphatic: No abdominal aortic aneurysm. No abdominal lymphadenopathy. Other:  No intraperitoneal free

## 2022-03-09 NOTE — ED Notes (Signed)
Rn informed bed ?

## 2022-03-09 NOTE — ED Notes (Signed)
Pt given oral swabs due to dry mouth ?

## 2022-03-10 ENCOUNTER — Inpatient Hospital Stay: Payer: Self-pay | Admitting: Registered Nurse

## 2022-03-10 ENCOUNTER — Encounter
Admission: EM | Disposition: A | Discharge status: Home / Self Care | Payer: Self-pay | Source: Home / Self Care | Attending: Emergency Medicine

## 2022-03-10 ENCOUNTER — Inpatient Hospital Stay: Payer: Self-pay

## 2022-03-10 ENCOUNTER — Encounter: Payer: Self-pay | Admitting: Internal Medicine

## 2022-03-10 HISTORY — PX: ENDOSCOPIC RETROGRADE CHOLANGIOPANCREATOGRAPHY (ERCP) WITH PROPOFOL: SHX5810

## 2022-03-10 LAB — COMPREHENSIVE METABOLIC PANEL
ALT: 170 U/L — ABNORMAL HIGH (ref 0–44)
AST: 166 U/L — ABNORMAL HIGH (ref 15–41)
Albumin: 3.3 g/dL — ABNORMAL LOW (ref 3.5–5.0)
Alkaline Phosphatase: 179 U/L — ABNORMAL HIGH (ref 38–126)
Anion gap: 7 (ref 5–15)
BUN: 6 mg/dL (ref 6–20)
CO2: 23 mmol/L (ref 22–32)
Calcium: 8.2 mg/dL — ABNORMAL LOW (ref 8.9–10.3)
Chloride: 108 mmol/L (ref 98–111)
Creatinine, Ser: 0.61 mg/dL (ref 0.44–1.00)
GFR, Estimated: >60 mL/min (ref >60)
Glucose, Bld: 98 mg/dL (ref 70–99)
Potassium: 3.6 mmol/L (ref 3.5–5.1)
Sodium: 138 mmol/L (ref 135–145)
Total Bilirubin: 1.5 mg/dL — ABNORMAL HIGH (ref 0.3–1.2)
Total Protein: 6.4 g/dL — ABNORMAL LOW (ref 6.5–8.1)

## 2022-03-10 LAB — CBC
HCT: 37.2 % (ref 36.0–46.0)
Hemoglobin: 11.9 g/dL — ABNORMAL LOW (ref 12.0–15.0)
MCH: 26.9 pg (ref 26.0–34.0)
MCHC: 32 g/dL (ref 30.0–36.0)
MCV: 84 fL (ref 80.0–100.0)
Platelets: 279 10*3/uL (ref 150–400)
RBC: 4.43 MIL/uL (ref 3.87–5.11)
RDW: 14.9 % (ref 11.5–15.5)
WBC: 6.7 10*3/uL (ref 4.0–10.5)
nRBC: 0 % (ref 0.0–0.2)

## 2022-03-10 LAB — HIV ANTIBODY (ROUTINE TESTING W REFLEX): HIV Screen 4th Generation wRfx: NONREACTIVE

## 2022-03-10 LAB — GLUCOSE, CAPILLARY: Glucose-Capillary: 93 mg/dL (ref 70–99)

## 2022-03-10 SURGERY — ENDOSCOPIC RETROGRADE CHOLANGIOPANCREATOGRAPHY (ERCP) WITH PROPOFOL
Anesthesia: General

## 2022-03-10 MED ORDER — PROPOFOL 500 MG/50ML IV EMUL
INTRAVENOUS | Status: DC | PRN
  Administered 2022-03-10: 75 ug/kg/min via INTRAVENOUS

## 2022-03-10 MED ORDER — SUGAMMADEX SODIUM 500 MG/5ML IV SOLN
INTRAVENOUS | Status: AC
  Filled 2022-03-10: qty 5

## 2022-03-10 MED ORDER — ONDANSETRON HCL 4 MG/2ML IJ SOLN
INTRAMUSCULAR | Status: AC
Start: 2022-03-10 — End: ?
  Filled 2022-03-10: qty 2

## 2022-03-10 MED ORDER — SUCCINYLCHOLINE CHLORIDE 200 MG/10ML IV SOSY
PREFILLED_SYRINGE | INTRAVENOUS | Status: DC | PRN
  Administered 2022-03-10: 50 mg via INTRAVENOUS

## 2022-03-10 MED ORDER — FENTANYL CITRATE (PF) 100 MCG/2ML IJ SOLN
INTRAMUSCULAR | Status: DC | PRN
Start: 2022-03-10 — End: 2022-03-10
  Administered 2022-03-10 (×2): 50 ug via INTRAVENOUS

## 2022-03-10 MED ORDER — DEXAMETHASONE SODIUM PHOSPHATE 10 MG/ML IJ SOLN
INTRAMUSCULAR | Status: AC
Start: 2022-03-10 — End: ?
  Filled 2022-03-10: qty 1

## 2022-03-10 MED ORDER — MIDAZOLAM HCL 2 MG/2ML IJ SOLN
INTRAMUSCULAR | Status: DC | PRN
  Administered 2022-03-10: 2 mg via INTRAVENOUS

## 2022-03-10 MED ORDER — INDOMETHACIN 50 MG RE SUPP
RECTAL | Status: DC | PRN
  Administered 2022-03-10: 100 mg via RECTAL

## 2022-03-10 MED ORDER — ONDANSETRON HCL 4 MG/2ML IJ SOLN
INTRAMUSCULAR | Status: DC | PRN
  Administered 2022-03-10: 4 mg via INTRAVENOUS

## 2022-03-10 MED ORDER — SUCCINYLCHOLINE CHLORIDE 200 MG/10ML IV SOSY
PREFILLED_SYRINGE | INTRAVENOUS | Status: AC
  Filled 2022-03-10: qty 10

## 2022-03-10 MED ORDER — INDOMETHACIN 50 MG RE SUPP
RECTAL | Status: AC
  Filled 2022-03-10: qty 2

## 2022-03-10 MED ORDER — FENTANYL CITRATE (PF) 100 MCG/2ML IJ SOLN
INTRAMUSCULAR | Status: AC
  Filled 2022-03-10: qty 2

## 2022-03-10 MED ORDER — ROCURONIUM BROMIDE 10 MG/ML (PF) SYRINGE
PREFILLED_SYRINGE | INTRAVENOUS | Status: AC
  Filled 2022-03-10: qty 10

## 2022-03-10 MED ORDER — DEXAMETHASONE SODIUM PHOSPHATE 10 MG/ML IJ SOLN
INTRAMUSCULAR | Status: DC | PRN
Start: 2022-03-10 — End: 2022-03-10
  Administered 2022-03-10: 5 mg via INTRAVENOUS

## 2022-03-10 MED ORDER — INDOMETHACIN 50 MG RE SUPP: 100.0000 mg | Freq: Once | RECTAL | Status: DC

## 2022-03-10 MED ORDER — ATROPINE SULFATE 1 MG/ML IV SOLN
INTRAVENOUS | Status: DC | PRN
  Administered 2022-03-10: .3 mg via INTRAVENOUS

## 2022-03-10 MED ORDER — PROPOFOL 10 MG/ML IV BOLUS
INTRAVENOUS | Status: DC | PRN
  Administered 2022-03-10: 150 mg via INTRAVENOUS

## 2022-03-10 MED ORDER — LACTATED RINGERS IV SOLN: INTRAVENOUS | Status: DC

## 2022-03-10 MED ORDER — INDOMETHACIN 50 MG RE SUPP
100.0000 mg | Freq: Once | RECTAL | Status: DC
  Filled 2022-03-10: qty 2

## 2022-03-10 MED ORDER — MIDAZOLAM HCL 2 MG/2ML IJ SOLN
INTRAMUSCULAR | Status: AC
  Filled 2022-03-10: qty 2

## 2022-03-10 NOTE — Transfer of Care (Signed)
Immediate Anesthesia Transfer of Care Note ? ?Patient: Candice Garcia ? ?Procedure(s) Performed: ENDOSCOPIC RETROGRADE CHOLANGIOPANCREATOGRAPHY (ERCP) WITH PROPOFOL ? ?Patient Location: Endoscopy Unit ? ?Anesthesia Type:General ? ?Level of Consciousness: drowsy ? ?Airway & Oxygen Therapy: Patient Spontanous Breathing and Patient connected to face mask oxygen ? ?Post-op Assessment: Report given to RN and Post -op Vital signs reviewed and stable ? ?Post vital signs: Reviewed and stable ? ?Last Vitals:  ?Vitals Value Taken Time  ?BP 146/75 03/10/22 1701  ?Temp 36.6 ?C 03/10/22 1700  ?Pulse 99 03/10/22 1705  ?Resp 18 03/10/22 1705  ?SpO2 91 % 03/10/22 1705  ?Vitals shown include unvalidated device data. ? ?Last Pain:  ?Vitals:  ? 03/10/22 1700  ?TempSrc: Temporal  ?PainSc: 0-No pain  ?   ? ?Patients Stated Pain Goal: 0 (03/10/22 1010) ? ?Complications: No notable events documented. ?

## 2022-03-10 NOTE — TOC Initial Note (Signed)
Transition of Care (TOC) - Initial/Assessment Note  ? ? ?Patient Details  ?Name: Candice Garcia ?MRN: [id number] ?Date of Birth: Jan 29, 1971 ? ?Transition of Care (TOC) CM/SW Contact:    ?Beverly Sessions, RN ?Phone Number: ?03/10/2022, 2:35 PM ? ?Clinical Narrative:                 ?.toc ? ?  ?  ? ? ?Patient Goals and CMS Choice ?  ?  ?  ? ?Expected Discharge Plan and Services ?  ?  ?  ?  ?  ?                ?  ?  ?  ?  ?  ?  ?  ?  ?  ?  ? ?Prior Living Arrangements/Services ?  ?  ?  ?       ?  ?  ?  ?  ? ?Activities of Daily Living ?Home Assistive Devices/Equipment: Eyeglasses ?ADL Screening (condition at time of admission) ?Patient's cognitive ability adequate to safely complete daily activities?: Yes ?Is the patient deaf or have difficulty hearing?: No ?Does the patient have difficulty seeing, even when wearing glasses/contacts?: No ?Does the patient have difficulty concentrating, remembering, or making decisions?: No ?Patient able to express need for assistance with ADLs?: Yes ?Does the patient have difficulty dressing or bathing?: No ?Independently performs ADLs?: Yes (appropriate for developmental age) ?Does the patient have difficulty walking or climbing stairs?: No ?Weakness of Legs: None ?Weakness of Arms/Hands: None ? ?Permission Sought/Granted ?  ?  ?   ?   ?   ?   ? ?Emotional Assessment ?  ?  ?  ?  ?  ?  ? ?Admission diagnosis:  Choledocholithiasis [K80.50] ?Upper abdominal pain [R10.10] ?Symptomatic cholelithiasis [K80.20] ?Patient Active Problem List  ? Diagnosis Date Noted  ? Choledocholithiasis 03/09/2022  ? Morbid obesity with body mass index (BMI) of 40.0 to 44.9 in adult [facility]) 03/09/2022  ? Cholelithiasis 03/09/2022  ? ?PCP:  Pcp, No ?Pharmacy:  No Pharmacies Listed ? ? ? ?Social Determinants of Health (SDOH) Interventions ?  ? ?Readmission Risk Interventions ?   ? View : No data to display.  ?  ?  ?  ? ? ? ?

## 2022-03-10 NOTE — Progress Notes (Signed)
?PROGRESS NOTE ? ? ? ?Candice Garcia  MHD:[id number] DOB: 1971/06/12 DOA: 03/08/2022 ?PCP: Pcp, No  ? ? ?Brief Narrative:  ?Candice Garcia is a 51 y.o. female with medical history significant of obesity with BMI 44.29, who presents with abdominal pain. ?  ?Patient states that her abdominal pain started at about 7:30 PM yesterday, which is located in the right upper quadrant and epigastric area, sharp, severe, aggravated by deep breath.  She also has some back pain, does not think this is radiated from abdominal pain.  Patient has nausea, no vomiting or diarrhea.  No fever or chills.  Patient does not have chest pain, cough, shortness of breath.  No symptoms of UTI. ? RUQ-ultrasound showed cholelithiasis without signs of cholecystitis.  MRCP showed cholelithiasis with choledocholithiasis without ductal dilation.  Patient is admitted to [facility] bed as inpatient.  Dr. Vicente Males of GI and Dr. Dahlia Byes of surgery are consulted. ? ?3/30 plan for ERCP today ? ?Consultants:  ?GI, surgery ? ?Procedures:  ? ?Antimicrobials:  ?Ceftriaxone ? ? ?Subjective: ?N.p.o. currently.  No nausea or vomiting.  No abdominal pain currently as she was given pain meds earlier.  ? ? ? ?Objective: ?Vitals:  ? 03/09/22 1900 03/09/22 1952 03/10/22 0446 03/10/22 0749  ?BP: 129/85 (!) 158/97 104/63 (!) 155/98  ?Pulse: 80 82 77 71  ?Resp: 17 18 18 20   ?Temp: 97.8 ?F (36.6 ?C) 98.5 ?F (36.9 ?C) 98.2 ?F (36.8 ?C) 98.3 ?F (36.8 ?C)  ?TempSrc: Oral Oral  Oral  ?SpO2: 95% 98% 96% 96%  ?Weight:      ?Height:      ? ? ?Intake/Output Summary (Last 24 hours) at 03/10/2022 1500 ?Last data filed at 03/10/2022 0447 ?Gross per 24 hour  ?Intake 2631.89 ml  ?Output 3 ml  ?Net 2628.89 ml  ? ?Filed Weights  ? 03/08/22 2151  ?Weight: 113.4 kg  ? ? ?Examination: ?Calm, NAD ?Cta no w/r ?Reg s1/s2 no gallop ?Soft benign +bs ?No edema ?Aaoxox3  ?Mood and affect appropriate in current setting  ? ? ? ?Data Reviewed: I have personally reviewed following labs and imaging  studies ? ?CBC: ?Recent Labs  ?Lab 03/08/22 ?2155 03/10/22 ?2119  ?WBC 9.0 6.7  ?HGB 13.6 11.9*  ?HCT 43.4 37.2  ?MCV 83.1 84.0  ?PLT 335 279  ? ?Basic Metabolic Panel: ?Recent Labs  ?Lab 03/08/22 ?2155 03/10/22 ?4174  ?NA 139 138  ?K 3.9 3.6  ?CL 107 108  ?CO2 25 23  ?GLUCOSE 113* 98  ?BUN 8 6  ?CREATININE 0.79 0.61  ?CALCIUM 9.1 8.2*  ? ?GFR: ?Estimated Creatinine Clearance: 100.9 mL/min (by C-G formula based on SCr of 0.61 mg/dL). ?Liver Function Tests: ?Recent Labs  ?Lab 03/08/22 ?2155 03/10/22 ?0814  ?AST 95* 166*  ?ALT 51* 170*  ?ALKPHOS 138* 179*  ?BILITOT 0.9 1.5*  ?PROT 8.1 6.4*  ?ALBUMIN 4.2 3.3*  ? ?Recent Labs  ?Lab 03/08/22 ?2155  ?LIPASE 41  ? ?No results for input(s): AMMONIA in the last 168 hours. ?Coagulation Profile: ?Recent Labs  ?Lab 03/09/22 ?2032  ?INR 1.0  ? ?Cardiac Enzymes: ?No results for input(s): CKTOTAL, CKMB, CKMBINDEX, TROPONINI in the last 168 hours. ?BNP (last 3 results) ?No results for input(s): PROBNP in the last 8760 hours. ?HbA1C: ?No results for input(s): HGBA1C in the last 72 hours. ?CBG: ?Recent Labs  ?Lab 03/10/22 ?4818  ?GLUCAP 93  ? ?Lipid Profile: ?No results for input(s): CHOL, HDL, LDLCALC, TRIG, CHOLHDL, LDLDIRECT in the last 72 hours. ?Thyroid Function Tests: ?  No results for input(s): TSH, T4TOTAL, FREET4, T3FREE, THYROIDAB in the last 72 hours. ?Anemia Panel: ?No results for input(s): VITAMINB12, FOLATE, FERRITIN, TIBC, IRON, RETICCTPCT in the last 72 hours. ?Sepsis Labs: ?No results for input(s): PROCALCITON, LATICACIDVEN in the last 168 hours. ? ?No results found for this or any previous visit (from the past 240 hour(s)).  ? ? ? ? ? ?Radiology Studies: ?DG Chest 2 View ? ?Result Date: 03/08/2022 ?CLINICAL DATA:  Midsternal chest pain for 2 hours EXAM: CHEST - 2 VIEW COMPARISON:  None. FINDINGS: The heart size and mediastinal contours are within normal limits. Both lungs are clear. The visualized skeletal structures are unremarkable. IMPRESSION: No active  cardiopulmonary disease. Electronically Signed   By: Randa Ngo M.D.   On: 03/08/2022 22:13  ? ?MR ABDOMEN MRCP WO CONTRAST ? ?Result Date: 03/09/2022 ?CLINICAL DATA:  Cholelithiasis.  Upper abdominal pain. EXAM: MRI ABDOMEN WITHOUT CONTRAST  (INCLUDING MRCP) TECHNIQUE: Multiplanar multisequence MR imaging of the abdomen was performed. Heavily T2-weighted images of the biliary and pancreatic ducts were obtained, and three-dimensional MRCP images were rendered by post processing. COMPARISON:  Ultrasound exam earlier same day FINDINGS: Lower chest: Basilar atelectasis noted bilaterally. Hepatobiliary: No suspicious focal abnormality in the liver on this study without intravenous contrast. Fine detail of liver parenchyma obscured by breathing motion. Multiple gallstones are evident ranging in size from 1-2 mm up to a dominant 17 mm stone. No evidence for gallbladder wall thickening or pericholecystic fluid. No intra or extrahepatic biliary duct dilatation with common bile duct measuring 6 mm diameter in the head of the pancreas. 1-2 mm stone is identified in the distal common bile duct (well seen on coronal haste image 16 of series 3 and coronal thin MIP image 5 of series 15). Pancreas: No focal mass lesion. No dilatation of the main duct. No intraparenchymal cyst. No peripancreatic edema. Spleen:  No splenomegaly. No focal mass lesion. Adrenals/Urinary Tract: No adrenal nodule or mass. Kidneys unremarkable. Stomach/Bowel: Stomach is unremarkable. No gastric wall thickening. No evidence of outlet obstruction. Duodenum is normally positioned as is the ligament of Treitz. No small bowel or colonic dilatation within the visualized abdomen. Vascular/Lymphatic: No abdominal aortic aneurysm. No abdominal lymphadenopathy. Other:  No intraperitoneal free fluid. Musculoskeletal: No suspicious abnormal marrow signal evident. IMPRESSION: Cholelithiasis with choledocholithiasis. No intra or extrahepatic biliary duct dilatation.  Electronically Signed   By: Misty Stanley M.D.   On: 03/09/2022 05:07  ? ?US ABDOMEN LIMITED RUQ (LIVER/GB) ? ?Result Date: 03/09/2022 ?CLINICAL DATA:  Right upper quadrant abdominal pain. EXAM: ULTRASOUND ABDOMEN LIMITED RIGHT UPPER QUADRANT COMPARISON:  None. FINDINGS: Gallbladder: There multiple stones within the gallbladder. No gallbladder wall thickening or pericholecystic fluid. Negative sonographic Murphy's sign. Common bile duct: Diameter: 3 mm Liver: No focal lesion identified. Within normal limits in parenchymal echogenicity. Portal vein is patent on color Doppler imaging with normal direction of blood flow towards the liver. Other: None. IMPRESSION: Cholelithiasis without sonographic evidence of acute cholecystitis. Electronically Signed   By: Anner Crete M.D.   On: 03/09/2022 02:23   ? ? ? ? ? ?Scheduled Meds: ? indomethacin  100 mg Rectal Once  ? pantoprazole  40 mg Oral Q1200  ? ?Continuous Infusions: ? sodium chloride 125 mL/hr at 03/10/22 1030  ? cefTRIAXone (ROCEPHIN)  IV Stopped (03/09/22 1949)  ? ? ?Assessment & Plan: ?  ?Principal Problem: ?  Choledocholithiasis ?Active Problems: ?  Cholelithiasis ?  Morbid obesity with body mass index (BMI) of 40.0 to 44.9  in adult [facility]) ? ? ?Cholelithiasis with choledocholithiasis:   ?Status post MRCP-showed cholelithiasis with Lenon Curt though cholelithiasis with no intra or extrahepatic biliary dilatation ?Right upper quadrant demonstrated cholelithiasis without evidence of acute cholecystitis ?GI and surgery consulted ?Plan for ERCP today ?Lipase not elevated ?Plan for CCK on Friday ?LFTs are elevated, T. bili ? ?Morbid obesity with body mass index (BMI) of 40.0 to 44.9 in adult [facility]): BMI 44.29 ?Overall affects prognosis ?Would encourage to do lifestyle modification and exercise ?  ? ? ?DVT prophylaxis: SCD ?Code Status: Full ?Family Communication: None at bedside ?Disposition Plan:  ?Status is: Inpatient ?Remains inpatient appropriate because: Needs ERCP  and surgery on Friday.  IV treatment ?  ? ? ? ? ? LOS: 1 day  ? ?Time spent: 35 minutes ? ? ? ?Nolberto Hanlon, MD ?[facility] ?Pager [phone] ? ?If 7PM-7AM, please contact night-coverage ?03/10/2022, 3:00 PM   ?

## 2022-03-10 NOTE — Anesthesia Preprocedure Evaluation (Signed)
Anesthesia Evaluation  ?Patient identified by MRN, date of birth, ID band ?Patient awake ? ? ? ?Reviewed: ?Allergy & Precautions, NPO status , Patient's Chart, lab work & pertinent test results ? ?History of Anesthesia Complications ?Negative for: history of anesthetic complications ? ?Airway ?Mallampati: III ? ?TM Distance: <3 FB ?Neck ROM: full ? ? ? Dental ? ?(+) Chipped ?  ?Pulmonary ?neg shortness of breath, sleep apnea ,  ?  ?Pulmonary exam normal ? ? ? ? ? ? ? Cardiovascular ?Exercise Tolerance: Good ?(-) angina(-) Past MI and (-) DOE negative cardio ROS ?Normal cardiovascular exam ? ? ?  ?Neuro/Psych ?negative neurological ROS ? negative psych ROS  ? GI/Hepatic ?negative GI ROS, Neg liver ROS, neg GERD  ,  ?Endo/Other  ?negative endocrine ROS ? Renal/GU ?  ? ?  ?Musculoskeletal ? ? Abdominal ?  ?Peds ? Hematology ?negative hematology ROS ?(+)   ?Anesthesia Other Findings ?Past Medical History: ?No date: Morbid obesity with body mass index (BMI) of 40.0 to 44.9 in  ?adult [facility]) ? ?Past Surgical History: ?No date: CESAREAN SECTION ?No date: TONSILLECTOMY ? ?BMI   ? Body Mass Index: 44.29 kg/m?  ?  ? ? Reproductive/Obstetrics ?negative OB ROS ? ?  ? ? ? ? ? ? ? ? ? ? ? ? ? ?  ?  ? ? ? ? ? ? ? ? ?Anesthesia Physical ?Anesthesia Plan ? ?ASA: 3 and emergent ? ?Anesthesia Plan: General ETT  ? ?Post-op Pain Management:   ? ?Induction: Intravenous ? ?PONV Risk Score and Plan: Ondansetron, Dexamethasone, Midazolam and Treatment may vary due to age or medical condition ? ?Airway Management Planned: Oral ETT ? ?Additional Equipment:  ? ?Intra-op Plan:  ? ?Post-operative Plan: Extubation in OR ? ?Informed Consent: I have reviewed the patients History and Physical, chart, labs and discussed the procedure including the risks, benefits and alternatives for the proposed anesthesia with the patient or authorized representative who has indicated his/her understanding and acceptance.   ? ? ? ?Dental Advisory Given ? ?Plan Discussed with: Anesthesiologist, CRNA and Surgeon ? ?Anesthesia Plan Comments: (Patient consented for risks of anesthesia including but not limited to:  ?- adverse reactions to medications ?- damage to eyes, teeth, lips or other oral mucosa ?- nerve damage due to positioning  ?- sore throat or hoarseness ?- Damage to heart, brain, nerves, lungs, other parts of body or loss of life ? ?Patient voiced understanding.)  ? ? ? ? ? ? ?Anesthesia Quick Evaluation ? ?

## 2022-03-10 NOTE — Op Note (Signed)
[facility] ?Gastroenterology ?Patient Name: Candice Garcia ?Procedure Date: 03/10/2022 4:11 PM ?MRN: [id number] ?Account #: [id number] ?Date of Birth: 07-17-1971 ?Admit Type: Inpatient ?Age: 51 ?Room: [facility] ENDO ROOM 4 ?Gender: Female ?Note Status: Finalized ?Instrument Name: [id number]-[id number] ?Procedure:             ERCP ?Indications:           Common bile duct stone(s) ?Providers:             Lucilla Lame MD, MD ?Medicines:             General Anesthesia ?Complications:         No immediate complications. ?Procedure:             Pre-Anesthesia Assessment: ?                       - Prior to the procedure, a History and Physical was  ?                       performed, and patient medications and allergies were  ?                       reviewed. The patient's tolerance of previous  ?                       anesthesia was also reviewed. The risks and benefits  ?                       of the procedure and the sedation options and risks  ?                       were discussed with the patient. All questions were  ?                       answered, and informed consent was obtained. Prior  ?                       Anticoagulants: The patient has taken no previous  ?                       anticoagulant or antiplatelet agents. ASA Grade  ?                       Assessment: II - A patient with mild systemic disease.  ?                       After reviewing the risks and benefits, the patient  ?                       was deemed in satisfactory condition to undergo the  ?                       procedure. ?                       After obtaining informed consent, the scope was passed  ?                       under direct vision. Throughout the procedure, the  ?  patient's blood pressure, pulse, and oxygen  ?                       saturations were monitored continuously. The  ?                       Duodenoscope was introduced through the mouth, and  ?                       used to inject contrast  into and used to inject  ?                       contrast into the bile duct. The ERCP was accomplished  ?                       without difficulty. The patient tolerated the  ?                       procedure well. ?Findings: ?     The scout film was normal. The esophagus was successfully intubated  ?     under direct vision. The scope was advanced to a normal major papilla in  ?     the descending duodenum without detailed examination of the pharynx,  ?     larynx and associated structures, and upper GI tract. The upper GI tract  ?     was grossly normal. The bile duct was deeply cannulated with the  ?     short-nosed traction sphincterotome. Contrast was injected. I personally  ?     interpreted the bile duct images. There was brisk flow of contrast  ?     through the ducts. Image quality was excellent. Contrast extended to the  ?     entire biliary tree. A wire was passed into the biliary tree. A 7 mm  ?     biliary sphincterotomy was made with a traction (standard)  ?     sphincterotome using ERBE electrocautery. There was no  ?     post-sphincterotomy bleeding. The biliary tree was swept with a 15 mm  ?     balloon starting at the bifurcation. Sludge was swept from the duct. ?Impression:            - A biliary sphincterotomy was performed. ?                       - The biliary tree was swept and sludge was found. ?Recommendation:        - Clear liquid diet. ?                       - Continue present medications. ?                       - Return patient to hospital ward for ongoing care. ?Procedure Code(s):     --- Professional --- ?                       [phone], Endoscopic retrograde cholangiopancreatography  ?                       (ERCP); with removal of calculi/debris from  ?  biliary/pancreatic duct(s) ?                       81859, Endoscopic retrograde cholangiopancreatography  ?                       (ERCP); with sphincterotomy/papillotomy ?                       [id number], Endoscopic  catheterization of the biliary  ?                       ductal system, radiological supervision and  ?                       interpretation ?Diagnosis Code(s):     --- Professional --- ?                       K80.50, Calculus of bile duct without cholangitis or  ?                       cholecystitis without obstruction ?CPT copyright 2019 American Medical Association. All rights reserved. ?The codes documented in this report are preliminary and upon coder review may  ?be revised to meet current compliance requirements. ?Lucilla Lame MD, MD ?03/10/2022 4:47:02 PM ?This report has been signed electronically. ?Number of Addenda: 0 ?Note Initiated On: 03/10/2022 4:11 PM ?Estimated Blood Loss:  Estimated blood loss: none. ?     [facility] ?

## 2022-03-10 NOTE — Anesthesia Procedure Notes (Signed)
Procedure Name: Intubation ?Date/Time: 03/10/2022 4:17 PM ?Performed by: Lia Foyer, CRNA ?Pre-anesthesia Checklist: Patient identified, Emergency Drugs available, Suction available and Patient being monitored ?Patient Re-evaluated:Patient Re-evaluated prior to induction ?Oxygen Delivery Method: Circle system utilized ?Preoxygenation: Pre-oxygenation with 100% oxygen ?Induction Type: IV induction ?Ventilation: Mask ventilation without difficulty ?Laryngoscope Size: McGraph and 3 ?Grade View: Grade I ?Tube type: Oral ?Tube size: 7.0 mm ?Number of attempts: 1 ?Airway Equipment and Method: Stylet and Video-laryngoscopy ?Placement Confirmation: ETT inserted through vocal cords under direct vision, positive ETCO2 and breath sounds checked- equal and bilateral ?Secured at: 19 cm ?Tube secured with: Tape ?Dental Injury: Teeth and Oropharynx as per pre-operative assessment  ? ? ? ? ?

## 2022-03-10 NOTE — Progress Notes (Signed)
[facility] ?SURGICAL PROGRESS NOTE (cpt [phone]) ? ?Hospital Day(s): 1.  ? ?Interval History: Patient seen and examined, no acute events or new complaints overnight. Patient reports she had some abdominal pain and nausea late yesterday but this improved with pain medication and antiemetics. She denies fever, chills, emesis, or bowel changes. She remains without leukocytosis; WBC 6.7K. Renal function is normal; sCr - 0.61. LFTs slightly worse this morning and mild hyperbilirubinemia is now seen at 1.5. Plan for ERCP today with Dr Allen Norris.  ? ?Review of Systems:  ?Constitutional: denies fever, chills  ?HEENT: denies cough or congestion  ?Respiratory: denies any shortness of breath  ?Cardiovascular: denies chest pain or palpitations  ?Gastrointestinal: denies abdominal pain, N/V, or diarrhea ?Genitourinary: denies burning with urination or urinary frequency ?Musculoskeletal: denies pain, decreased motor or sensation ? ?Vital signs in last 24 hours: [min-max] current  ?Temp:  [97.7 ?F (36.5 ?C)-98.5 ?F (36.9 ?C)] 98.3 ?F (36.8 ?C) (03/30 0749) ?Pulse Rate:  [71-83] 71 (03/30 0749) ?Resp:  [13-20] 20 (03/30 0749) ?BP: (104-158)/(63-98) 155/98 (03/30 0749) ?SpO2:  [92 %-98 %] 96 % (03/30 0749)     Height: 5' 3"  (160 cm) Weight: 113.4 kg BMI (Calculated): 44.3  ? ?Intake/Output last 2 shifts:  ?03/29 0701 - 03/30 0700 ?In: 3266.6 [I.V.:3164.6; IV Piggyback:102] ?Out: 3 [Urine:2; Stool:1]  ? ?Physical Exam:  ?Constitutional: alert, cooperative and no distress  ?HENT: normocephalic without obvious abnormality  ?Eyes: PERRL, EOM's grossly intact and symmetric  ?Neuro: CN II - XII grossly intact and symmetric without deficit  ?Respiratory: breathing non-labored at rest  ?Cardiovascular: regular rate and sinus rhythm  ?Gastrointestinal: soft, non-tender this morning, and non-distended, no rebound/guarding ?Musculoskeletal: UE and LE FROM, no edema or wounds, motor and sensation grossly intact, NT  ? ? ?Labs:  ? ?   Latest Ref Rng & Units 03/10/2022  ?  3:47 AM 03/08/2022  ?  9:55 PM 05/18/2013  ? 10:28 PM  ?CBC  ?WBC 4.0 - 10.5 K/uL 6.7   9.0   10.4    ?Hemoglobin 12.0 - 15.0 g/dL 11.9   13.6   14.1    ?Hematocrit 36.0 - 46.0 % 37.2   43.4   42.1    ?Platelets 150 - 400 K/uL 279   335   298    ? ? ?  Latest Ref Rng & Units 03/10/2022  ?  3:47 AM 03/08/2022  ?  9:55 PM 05/18/2013  ? 10:28 PM  ?CMP  ?Glucose 70 - 99 mg/dL 98   113   101    ?BUN 6 - 20 mg/dL 6   8   7     ?Creatinine 0.44 - 1.00 mg/dL 0.61   0.79   0.74    ?Sodium 135 - 145 mmol/L 138   139   139    ?Potassium 3.5 - 5.1 mmol/L 3.6   3.9   3.6    ?Chloride 98 - 111 mmol/L 108   107   110    ?CO2 22 - 32 mmol/L 23   25   22     ?Calcium 8.9 - 10.3 mg/dL 8.2   9.1   8.6    ?Total Protein 6.5 - 8.1 g/dL 6.4   8.1   7.4    ?Total Bilirubin 0.3 - 1.2 mg/dL 1.5   0.9   0.4    ?Alkaline Phos 38 - 126 U/L 179   138   111    ?AST 15 - 41 U/L 166  95   23    ?ALT 0 - 44 U/L 170   51   26    ? ? ? ?Imaging studies: No new pertinent imaging studies ? ? ?Assessment/Plan: (ICD-10's:  K80.50) ?51 y.o. female with choledocholithiasis without cholecystitis  ?  ?           - Appreciate GI assistance; plan for ERCP today     ?- She will benefit from cholecystectomy this admission after ERCP. Plan for this to be completed on Friday with Dr Dahlia Byes pending OR/Anesthesia availability   ?           - NPO for GI procedure today; okay to resume CLD after. Needs to be NPO at midnight tonight ?- IVF resuscitation ?- Monitor abdominal examination ?- Pain control prn; antiemetics prn ?            - Monitor LFTs ?           - Mobilization as tolerated  ?           - Further management per primary service; we will follow  ?  ?All of the above findings and recommendations were discussed with the patient, and all of patient's questions were answered to her expressed satisfaction. ? ?-- ?Edison Simon, PA-C ?[facility] ?03/10/2022, 8:45 AM ?[phone] ?M-F: 7am - 4pm ? ?

## 2022-03-10 NOTE — Anesthesia Postprocedure Evaluation (Signed)
Anesthesia Post Note ? ?Patient: Candice Garcia ? ?Procedure(s) Performed: ENDOSCOPIC RETROGRADE CHOLANGIOPANCREATOGRAPHY (ERCP) WITH PROPOFOL ? ?Patient location during evaluation: Endoscopy ?Anesthesia Type: General ?Level of consciousness: awake and alert ?Pain management: pain level controlled ?Vital Signs Assessment: post-procedure vital signs reviewed and stable ?Respiratory status: spontaneous breathing, nonlabored ventilation, respiratory function stable and patient connected to nasal cannula oxygen ?Cardiovascular status: blood pressure returned to baseline and stable ?Postop Assessment: no apparent nausea or vomiting ?Anesthetic complications: no ? ? ?No notable events documented. ? ? ?Last Vitals:  ?Vitals:  ? 03/10/22 1710 03/10/22 1720  ?BP: 138/76 (!) 152/93  ?Pulse: 90 98  ?Resp: 16 17  ?Temp:    ?SpO2: 96% 92%  ?  ?Last Pain:  ?Vitals:  ? 03/10/22 1720  ?TempSrc:   ?PainSc: 0-No pain  ? ? ?  ?  ?  ?  ?  ?  ? ?[name] [name] ? ? ? ? ?

## 2022-03-11 ENCOUNTER — Other Ambulatory Visit: Payer: Self-pay

## 2022-03-11 ENCOUNTER — Inpatient Hospital Stay: Payer: Self-pay | Admitting: Registered Nurse

## 2022-03-11 ENCOUNTER — Encounter
Admission: EM | Disposition: A | Discharge status: Home / Self Care | Payer: Self-pay | Source: Home / Self Care | Attending: Emergency Medicine

## 2022-03-11 ENCOUNTER — Encounter: Payer: Self-pay | Admitting: Gastroenterology

## 2022-03-11 DIAGNOSIS — K8062 Calculus of gallbladder and bile duct with acute cholecystitis without obstruction: Secondary | ICD-10-CM

## 2022-03-11 DIAGNOSIS — Z8719 Personal history of other diseases of the digestive system: Secondary | ICD-10-CM

## 2022-03-11 LAB — CBC
HCT: 39.6 % (ref 36.0–46.0)
Hemoglobin: 12.6 g/dL (ref 12.0–15.0)
MCH: 26.9 pg (ref 26.0–34.0)
MCHC: 31.8 g/dL (ref 30.0–36.0)
MCV: 84.4 fL (ref 80.0–100.0)
Platelets: 317 10*3/uL (ref 150–400)
RBC: 4.69 MIL/uL (ref 3.87–5.11)
RDW: 15 % (ref 11.5–15.5)
WBC: 13 10*3/uL — ABNORMAL HIGH (ref 4.0–10.5)
nRBC: 0 % (ref 0.0–0.2)

## 2022-03-11 LAB — CREATININE, SERUM
Creatinine, Ser: 0.72 mg/dL (ref 0.44–1.00)
GFR, Estimated: >60 mL/min (ref >60)

## 2022-03-11 LAB — GLUCOSE, CAPILLARY: Glucose-Capillary: 117 mg/dL — ABNORMAL HIGH (ref 70–99)

## 2022-03-11 SURGERY — CHOLECYSTECTOMY, ROBOT-ASSISTED, LAPAROSCOPIC
Anesthesia: General

## 2022-03-11 MED ORDER — PROPOFOL 10 MG/ML IV BOLUS
INTRAVENOUS | Status: AC
  Filled 2022-03-11: qty 20

## 2022-03-11 MED ORDER — PROCHLORPERAZINE MALEATE 10 MG PO TABS
10.0000 mg | ORAL_TABLET | Freq: Four times a day (QID) | ORAL | Status: DC | PRN
  Filled 2022-03-11: qty 1

## 2022-03-11 MED ORDER — ROCURONIUM BROMIDE 100 MG/10ML IV SOLN
INTRAVENOUS | Status: DC | PRN
  Administered 2022-03-11: 20 mg via INTRAVENOUS
  Administered 2022-03-11: 50 mg via INTRAVENOUS
  Administered 2022-03-11: 20 mg via INTRAVENOUS

## 2022-03-11 MED ORDER — PROPOFOL 10 MG/ML IV BOLUS
INTRAVENOUS | Status: DC | PRN
  Administered 2022-03-11: 150 mg via INTRAVENOUS

## 2022-03-11 MED ORDER — INDOCYANINE GREEN 25 MG IV SOLR
2.5000 mg | INTRAVENOUS | Status: AC
  Administered 2022-03-11: 2.5 mg via INTRAVENOUS
  Filled 2022-03-11: qty 1

## 2022-03-11 MED ORDER — ONDANSETRON HCL 4 MG/2ML IJ SOLN: 4.0000 mg | Freq: Four times a day (QID) | INTRAMUSCULAR | Status: DC | PRN

## 2022-03-11 MED ORDER — MIDAZOLAM HCL 2 MG/2ML IJ SOLN
INTRAMUSCULAR | Status: DC | PRN
  Administered 2022-03-11: 2 mg via INTRAVENOUS

## 2022-03-11 MED ORDER — FENTANYL CITRATE (PF) 100 MCG/2ML IJ SOLN
INTRAMUSCULAR | Status: AC
  Filled 2022-03-11: qty 2

## 2022-03-11 MED ORDER — LIDOCAINE HCL (CARDIAC) PF 100 MG/5ML IV SOSY
PREFILLED_SYRINGE | INTRAVENOUS | Status: DC | PRN
  Administered 2022-03-11: 100 mg via INTRAVENOUS

## 2022-03-11 MED ORDER — ONDANSETRON 4 MG PO TBDP: 4.0000 mg | ORAL_TABLET | Freq: Four times a day (QID) | ORAL | Status: DC | PRN

## 2022-03-11 MED ORDER — MIDAZOLAM HCL 2 MG/2ML IJ SOLN
INTRAMUSCULAR | Status: AC
  Filled 2022-03-11: qty 2

## 2022-03-11 MED ORDER — SODIUM CHLORIDE 0.9 % IV SOLN: INTRAVENOUS | Status: DC

## 2022-03-11 MED ORDER — BUPIVACAINE-EPINEPHRINE (PF) 0.25% -1:200000 IJ SOLN
INTRAMUSCULAR | Status: AC
  Filled 2022-03-11: qty 30

## 2022-03-11 MED ORDER — KETOROLAC TROMETHAMINE 30 MG/ML IJ SOLN
30.0000 mg | Freq: Four times a day (QID) | INTRAMUSCULAR | Status: DC
  Administered 2022-03-11 – 2022-03-12 (×3): 30 mg via INTRAVENOUS
  Filled 2022-03-11 (×3): qty 1

## 2022-03-11 MED ORDER — SUGAMMADEX SODIUM 200 MG/2ML IV SOLN
INTRAVENOUS | Status: DC | PRN
Start: 2022-03-11 — End: 2022-03-11
  Administered 2022-03-11: 500 mg via INTRAVENOUS

## 2022-03-11 MED ORDER — HYDROMORPHONE HCL 1 MG/ML IJ SOLN
INTRAMUSCULAR | Status: DC | PRN
  Administered 2022-03-11 (×2): .5 mg via INTRAVENOUS

## 2022-03-11 MED ORDER — OXYCODONE HCL 5 MG PO TABS
5.0000 mg | ORAL_TABLET | Freq: Once | ORAL | Status: AC | PRN
  Administered 2022-03-11: 5 mg via ORAL

## 2022-03-11 MED ORDER — KETOROLAC TROMETHAMINE 30 MG/ML IJ SOLN
INTRAMUSCULAR | Status: DC | PRN
  Administered 2022-03-11: 30 mg via INTRAVENOUS

## 2022-03-11 MED ORDER — DIPHENHYDRAMINE HCL 50 MG/ML IJ SOLN: 12.5000 mg | Freq: Four times a day (QID) | INTRAMUSCULAR | Status: DC | PRN

## 2022-03-11 MED ORDER — DROPERIDOL 2.5 MG/ML IJ SOLN
0.6250 mg | Freq: Once | INTRAMUSCULAR | Status: DC | PRN
  Filled 2022-03-11: qty 2

## 2022-03-11 MED ORDER — FENTANYL CITRATE (PF) 100 MCG/2ML IJ SOLN
INTRAMUSCULAR | Status: DC | PRN
  Administered 2022-03-11 (×2): 50 ug via INTRAVENOUS

## 2022-03-11 MED ORDER — FENTANYL CITRATE (PF) 100 MCG/2ML IJ SOLN
25.0000 ug | INTRAMUSCULAR | Status: DC | PRN
  Administered 2022-03-11: 50 ug via INTRAVENOUS

## 2022-03-11 MED ORDER — OXYCODONE HCL 5 MG PO TABS
ORAL_TABLET | ORAL | Status: AC
  Filled 2022-03-11: qty 1

## 2022-03-11 MED ORDER — GLYCOPYRROLATE 0.2 MG/ML IJ SOLN
INTRAMUSCULAR | Status: DC | PRN
  Administered 2022-03-11: .2 mg via INTRAVENOUS

## 2022-03-11 MED ORDER — PROMETHAZINE HCL 25 MG/ML IJ SOLN: 6.2500 mg | INTRAMUSCULAR | Status: DC | PRN

## 2022-03-11 MED ORDER — ENOXAPARIN SODIUM 40 MG/0.4ML IJ SOSY
40.0000 mg | PREFILLED_SYRINGE | INTRAMUSCULAR | Status: DC
  Administered 2022-03-12: 40 mg via SUBCUTANEOUS
  Filled 2022-03-11: qty 0.4

## 2022-03-11 MED ORDER — ONDANSETRON HCL 4 MG/2ML IJ SOLN
INTRAMUSCULAR | Status: DC | PRN
  Administered 2022-03-11: 4 mg via INTRAVENOUS

## 2022-03-11 MED ORDER — METOPROLOL TARTRATE 5 MG/5ML IV SOLN: 5.0000 mg | Freq: Four times a day (QID) | INTRAVENOUS | Status: DC | PRN

## 2022-03-11 MED ORDER — MORPHINE SULFATE (PF) 2 MG/ML IV SOLN: 2.0000 mg | INTRAVENOUS | Status: DC | PRN

## 2022-03-11 MED ORDER — PHENYLEPHRINE 40 MCG/ML (10ML) SYRINGE FOR IV PUSH (FOR BLOOD PRESSURE SUPPORT)
PREFILLED_SYRINGE | INTRAVENOUS | Status: DC | PRN
  Administered 2022-03-11 (×4): 80 ug via INTRAVENOUS

## 2022-03-11 MED ORDER — CEFAZOLIN SODIUM-DEXTROSE 2-4 GM/100ML-% IV SOLN
2.0000 g | Freq: Three times a day (TID) | INTRAVENOUS | Status: AC
  Administered 2022-03-11 – 2022-03-12 (×2): 2 g via INTRAVENOUS
  Filled 2022-03-11 (×2): qty 100

## 2022-03-11 MED ORDER — DIPHENHYDRAMINE HCL 12.5 MG/5ML PO ELIX
12.5000 mg | ORAL_SOLUTION | Freq: Four times a day (QID) | ORAL | Status: DC | PRN
  Filled 2022-03-11: qty 5

## 2022-03-11 MED ORDER — PROCHLORPERAZINE EDISYLATE 10 MG/2ML IJ SOLN: 5.0000 mg | Freq: Four times a day (QID) | INTRAMUSCULAR | Status: DC | PRN

## 2022-03-11 MED ORDER — DEXAMETHASONE SODIUM PHOSPHATE 10 MG/ML IJ SOLN
INTRAMUSCULAR | Status: DC | PRN
  Administered 2022-03-11: 10 mg via INTRAVENOUS

## 2022-03-11 MED ORDER — BUPIVACAINE LIPOSOME 1.3 % IJ SUSP
INTRAMUSCULAR | Status: AC
  Filled 2022-03-11: qty 20

## 2022-03-11 MED ORDER — HYDROMORPHONE HCL 1 MG/ML IJ SOLN
INTRAMUSCULAR | Status: AC
  Filled 2022-03-11: qty 1

## 2022-03-11 MED ORDER — ACETAMINOPHEN 500 MG PO TABS
1000.0000 mg | ORAL_TABLET | Freq: Four times a day (QID) | ORAL | Status: DC
  Administered 2022-03-11 – 2022-03-12 (×3): 1000 mg via ORAL
  Filled 2022-03-11 (×3): qty 2

## 2022-03-11 MED ORDER — SUGAMMADEX SODIUM 500 MG/5ML IV SOLN
INTRAVENOUS | Status: DC | PRN
  Administered 2022-03-10: 450 mg via INTRAVENOUS

## 2022-03-11 MED ORDER — BUPIVACAINE-EPINEPHRINE 0.25% -1:200000 IJ SOLN
INTRAMUSCULAR | Status: DC | PRN
  Administered 2022-03-11: 50 mL

## 2022-03-11 MED ORDER — EPHEDRINE SULFATE (PRESSORS) 50 MG/ML IJ SOLN
INTRAMUSCULAR | Status: DC | PRN
  Administered 2022-03-11: 5 mg via INTRAVENOUS

## 2022-03-11 MED ORDER — ACETAMINOPHEN 10 MG/ML IV SOLN: 1000.0000 mg | Freq: Once | INTRAVENOUS | Status: DC | PRN

## 2022-03-11 MED ORDER — MELATONIN 5 MG PO TABS: 2.5000 mg | ORAL_TABLET | Freq: Every evening | ORAL | Status: DC | PRN

## 2022-03-11 MED ORDER — OXYCODONE HCL 5 MG/5ML PO SOLN: 5.0000 mg | Freq: Once | ORAL | Status: AC | PRN

## 2022-03-11 MED ORDER — OXYCODONE HCL 5 MG PO TABS
5.0000 mg | ORAL_TABLET | ORAL | Status: DC | PRN
  Administered 2022-03-12 (×2): 5 mg via ORAL
  Filled 2022-03-11 (×2): qty 1

## 2022-03-11 MED ORDER — 0.9 % SODIUM CHLORIDE (POUR BTL) OPTIME
TOPICAL | Status: DC | PRN
  Administered 2022-03-11: 100 mL

## 2022-03-11 SURGICAL SUPPLY — 46 items
BAG RETRIEVAL 10 (BASKET) ×1
CANNULA REDUC XI 12-8 STAPL (CANNULA) ×1
CANNULA REDUCER 12-8 DVNC XI (CANNULA) ×1 IMPLANT
CLIP LIGATING HEMO O LOK GREEN (MISCELLANEOUS) ×2 IMPLANT
DERMABOND ADVANCED (GAUZE/BANDAGES/DRESSINGS) ×1
DERMABOND ADVANCED .7 DNX12 (GAUZE/BANDAGES/DRESSINGS) ×1 IMPLANT
DRAPE ARM DVNC X/XI (DISPOSABLE) ×4 IMPLANT
DRAPE COLUMN DVNC XI (DISPOSABLE) ×1 IMPLANT
DRAPE DA VINCI XI ARM (DISPOSABLE) ×4
DRAPE DA VINCI XI COLUMN (DISPOSABLE) ×1
ELECT CAUTERY BLADE 6.4 (BLADE) ×2 IMPLANT
ELECT REM PT RETURN 9FT ADLT (ELECTROSURGICAL) ×2
ELECTRODE REM PT RTRN 9FT ADLT (ELECTROSURGICAL) ×1 IMPLANT
GLOVE SURG ENC MOIS LTX SZ7 (GLOVE) ×4 IMPLANT
GOWN STRL REUS W/ TWL LRG LVL3 (GOWN DISPOSABLE) ×4 IMPLANT
GOWN STRL REUS W/TWL LRG LVL3 (GOWN DISPOSABLE) ×4
IRRIGATION STRYKERFLOW (MISCELLANEOUS) IMPLANT
IRRIGATOR STRYKERFLOW (MISCELLANEOUS) ×2
IV NS 1000ML (IV SOLUTION) ×1
IV NS 1000ML BAXH (IV SOLUTION) IMPLANT
KIT PINK PAD W/HEAD ARE REST (MISCELLANEOUS) ×2
KIT PINK PAD W/HEAD ARM REST (MISCELLANEOUS) ×1 IMPLANT
MANIFOLD NEPTUNE II (INSTRUMENTS) ×2 IMPLANT
NEEDLE HYPO 22GX1.5 SAFETY (NEEDLE) ×2 IMPLANT
NS IRRIG 500ML POUR BTL (IV SOLUTION) ×2 IMPLANT
OBTURATOR OPTICAL STANDARD 8MM (TROCAR) ×1
OBTURATOR OPTICAL STND 8 DVNC (TROCAR) ×1
OBTURATOR OPTICALSTD 8 DVNC (TROCAR) ×1 IMPLANT
PACK LAP CHOLECYSTECTOMY (MISCELLANEOUS) ×2 IMPLANT
PENCIL ELECTRO HAND CTR (MISCELLANEOUS) ×2 IMPLANT
SEAL CANN UNIV 5-8 DVNC XI (MISCELLANEOUS) ×3 IMPLANT
SEAL XI 5MM-8MM UNIVERSAL (MISCELLANEOUS) ×3
SET TUBE SMOKE EVAC HIGH FLOW (TUBING) ×2 IMPLANT
SOLUTION ELECTROLUBE (MISCELLANEOUS) ×2 IMPLANT
SPIKE FLUID TRANSFER (MISCELLANEOUS) ×2 IMPLANT
SPONGE T-LAP 18X18 ~~LOC~~+RFID (SPONGE) ×2 IMPLANT
STAPLER CANNULA SEAL DVNC XI (STAPLE) ×1 IMPLANT
STAPLER CANNULA SEAL XI (STAPLE) ×1
SUT MNCRL AB 4-0 PS2 18 (SUTURE) ×2 IMPLANT
SUT VICRYL 0 AB UR-6 (SUTURE) ×4 IMPLANT
SYR 20ML LL LF (SYRINGE) ×2 IMPLANT
SYS BAG RETRIEVAL 10MM (BASKET) ×1
SYSTEM BAG RETRIEVAL 10MM (BASKET) ×1 IMPLANT
TAPE TRANSPORE STRL 2 31045 (GAUZE/BANDAGES/DRESSINGS) ×2 IMPLANT
TROCAR BALLN GELPORT 12X130M (ENDOMECHANICALS) ×2 IMPLANT
WATER STERILE IRR 500ML POUR (IV SOLUTION) ×2 IMPLANT

## 2022-03-11 NOTE — Anesthesia Preprocedure Evaluation (Signed)
Anesthesia Evaluation  ?Patient identified by MRN, date of birth, ID band ?Patient awake ? ? ? ?Reviewed: ?Allergy & Precautions, NPO status , Patient's Chart, lab work & pertinent test results ? ?History of Anesthesia Complications ?Negative for: history of anesthetic complications ? ?Airway ?Mallampati: III ? ?TM Distance: <3 FB ?Neck ROM: full ? ? ? Dental ?no notable dental hx. ? ?  ?Pulmonary ?neg shortness of breath, sleep apnea ,  ?  ?Pulmonary exam normal ? ? ? ? ? ? ? Cardiovascular ?Exercise Tolerance: Good ?(-) angina(-) Past MI and (-) DOE negative cardio ROS ?Normal cardiovascular exam ? ? ?  ?Neuro/Psych ?negative neurological ROS ? negative psych ROS  ? GI/Hepatic ?Neg liver ROS, neg GERD  ,Cholycysitis ?  ?Endo/Other  ?negative endocrine ROS ? Renal/GU ?  ? ?  ?Musculoskeletal ? ? Abdominal ?(+) + obese,   ?Peds ? Hematology ?negative hematology ROS ?(+)   ?Anesthesia Other Findings ?Past Medical History: ?No date: Morbid obesity with body mass index (BMI) of 40.0 to 44.9 in  ?adult [facility]) ? ?Past Surgical History: ?No date: CESAREAN SECTION ?No date: TONSILLECTOMY ? ?BMI   ? Body Mass Index: 44.29 kg/m?  ?  ? ? Reproductive/Obstetrics ?negative OB ROS ? ?  ? ? ? ? ? ? ? ? ? ? ? ? ? ?  ?  ? ? ? ? ? ? ? ? ?Anesthesia Physical ? ?Anesthesia Plan ? ?ASA: 3 and emergent ? ?Anesthesia Plan: General ETT  ? ?Post-op Pain Management:   ? ?Induction: Intravenous ? ?PONV Risk Score and Plan: Ondansetron, Dexamethasone, Midazolam and Treatment may vary due to age or medical condition ? ?Airway Management Planned: Oral ETT ? ?Additional Equipment:  ? ?Intra-op Plan:  ? ?Post-operative Plan: Extubation in OR ? ?Informed Consent: I have reviewed the patients History and Physical, chart, labs and discussed the procedure including the risks, benefits and alternatives for the proposed anesthesia with the patient or authorized representative who has indicated his/her understanding and  acceptance.  ? ? ? ?Dental Advisory Given ? ?Plan Discussed with: Anesthesiologist, CRNA and Surgeon ? ?Anesthesia Plan Comments: (Patient consented for risks of anesthesia including but not limited to:  ?- adverse reactions to medications ?- damage to eyes, teeth, lips or other oral mucosa ?- nerve damage due to positioning  ?- sore throat or hoarseness ?- Damage to heart, brain, nerves, lungs, other parts of body or loss of life ? ?Patient voiced understanding.)  ? ? ? ? ? ? ?Anesthesia Quick Evaluation ? ?

## 2022-03-11 NOTE — Progress Notes (Signed)
? ?  Candice Garcia , MD ?[address], [place], [place], [place], [address] ?[address], [place], [place], [place], [address] ?Phone: [phone]  ?Fax: [phone] ? ? ?Candice Garcia is being followed for CBD stones S/p ERCP ? ?Subjective: ?Doing well after ERCP- no pain or vomiting , feels hungry  ? ? ?Objective: ?Vital signs in last 24 hours: ?Vitals:  ? 03/10/22 1759 03/10/22 1957 03/11/22 0431 03/11/22 0750  ?BP: (!) 151/96 (!) 161/97 126/78 (!) 142/86  ?Pulse: 75 92 93 89  ?Resp:  20 20 16   ?Temp:  98.5 ?F (36.9 ?C) 98.4 ?F (36.9 ?C) 97.7 ?F (36.5 ?C)  ?TempSrc:  Oral Oral Oral  ?SpO2:  96% 93% 93%  ?Weight:      ?Height:      ? ?Weight change:  ? ?Intake/Output Summary (Last 24 hours) at 03/11/2022 1242 ?Last data filed at 03/10/2022 1644 ?Gross per 24 hour  ?Intake 1586.71 ml  ?Output --  ?Net 1586.71 ml  ? ? ? ?Exam: ? ?Abdomen: soft, nontender, normal bowel sounds ? ? ?Lab Results: ?@LABTEST2 @ ?Micro Results: ?No results found for this or any previous visit (from the past 240 hour(s)). ?Studies/Results: ?DG C-Arm 1-60 Min-No Report ? ?Result Date: 03/10/2022 ?Fluoroscopy was utilized by the requesting physician.  No radiographic interpretation.   ?Medications: I have reviewed the patient's current medications. ?Scheduled Meds: ? indocyanine green  2.5 mg Intravenous On Call to OR  ? indomethacin  100 mg Rectal Once  ? pantoprazole  40 mg Oral Q1200  ? ?Continuous Infusions: ? sodium chloride 125 mL/hr at 03/11/22 0548  ? cefTRIAXone (ROCEPHIN)  IV 2 g (03/10/22 1804)  ? ?PRN Meds:.HYDROmorphone (DILAUDID) injection, ibuprofen, morphine injection, ondansetron (ZOFRAN) IV, oxyCODONE ? ? ?Assessment: ?Principal Problem: ?  Choledocholithiasis ?Active Problems: ?  Morbid obesity with body mass index (BMI) of 40.0 to 44.9 in adult [facility]) ?  Cholelithiasis ? ? ?[name] 51 y.o. female underwent ERCP for choledocholithiasis and has chololithiasis.No pain after procedure- feels hungry. Plan for  cholecystectomy later today .  ? ? ?I will sign off.  Please call me if any further GI concerns or questions.  We would like to thank you for the opportunity to participate in the care of Candice Garcia.  ?  ? ? LOS: 2 days  ? ?Candice Bellows, MD ?03/11/2022, 12:42 PM  ?

## 2022-03-11 NOTE — Anesthesia Procedure Notes (Signed)
Procedure Name: Intubation ?Date/Time: 03/11/2022 5:04 PM ?Performed by: Lily Peer, Trust Leh, CRNA ?Pre-anesthesia Checklist: Patient identified, Emergency Drugs available, Suction available and Patient being monitored ?Patient Re-evaluated:Patient Re-evaluated prior to induction ?Oxygen Delivery Method: Circle system utilized ?Preoxygenation: Pre-oxygenation with 100% oxygen ?Induction Type: IV induction ?Ventilation: Mask ventilation without difficulty ?Laryngoscope Size: McGraph and 3 ?Grade View: Grade I ?Tube type: Oral ?Tube size: 6.5 mm ?Number of attempts: 1 ?Airway Equipment and Method: Stylet ?Placement Confirmation: ETT inserted through vocal cords under direct vision, positive ETCO2 and breath sounds checked- equal and bilateral ?Secured at: 21 cm ?Tube secured with: Tape ?Dental Injury: Teeth and Oropharynx as per pre-operative assessment  ? ? ? ? ?

## 2022-03-11 NOTE — Addendum Note (Signed)
Addendum  created 03/11/22 0845 by Lia Foyer, CRNA  ? Flowsheet accepted, Intraprocedure Event edited, Intraprocedure Flowsheets edited, Intraprocedure Meds edited  ?  ?

## 2022-03-11 NOTE — Progress Notes (Signed)
?PROGRESS NOTE ? ? ? ?Candice Garcia  VPX:[id number] DOB: 12-23-1970 DOA: 03/08/2022 ?PCP: Pcp, No  ? ? ?Brief Narrative:  ?Candice Garcia is a 51 y.o. female with medical history significant of obesity with BMI 44.29, who presents with abdominal pain. ?  ?Patient states that her abdominal pain started at about 7:30 PM yesterday, which is located in the right upper quadrant and epigastric area, sharp, severe, aggravated by deep breath.  She also has some back pain, does not think this is radiated from abdominal pain.  Patient has nausea, no vomiting or diarrhea.  No fever or chills.  Patient does not have chest pain, cough, shortness of breath.  No symptoms of UTI. ? RUQ-ultrasound showed cholelithiasis without signs of cholecystitis.  MRCP showed cholelithiasis with choledocholithiasis without ductal dilation.  Patient is admitted to [place] bed as inpatient.  Dr. Vicente Males of GI and Dr. Dahlia Byes of surgery are consulted. ? ?3/30 plan for ERCP today ? ?Consultants:  ?GI, surgery ? ?Procedures:  ? ?Antimicrobials:  ?Ceftriaxone ? ? ?Subjective: ?N.p.o. currently.  No nausea or vomiting.  No abdominal pain currently as she was given pain meds earlier.  ? ? ? ?Objective: ?Vitals:  ? 03/10/22 1759 03/10/22 1957 03/11/22 0431 03/11/22 0750  ?BP: (!) 151/96 (!) 161/97 126/78 (!) 142/86  ?Pulse: 75 92 93 89  ?Resp:  20 20 16   ?Temp:  98.5 ?F (36.9 ?C) 98.4 ?F (36.9 ?C) 97.7 ?F (36.5 ?C)  ?TempSrc:  Oral Oral Oral  ?SpO2:  96% 93% 93%  ?Weight:      ?Height:      ? ? ?Intake/Output Summary (Last 24 hours) at 03/11/2022 1534 ?Last data filed at 03/10/2022 1644 ?Gross per 24 hour  ?Intake 300 ml  ?Output --  ?Net 300 ml  ? ?Filed Weights  ? 03/08/22 2151  ?Weight: 113.4 kg  ? ? ?Examination: ?Calm, NAD ?Cta no w/r ?Reg s1/s2 no gallop ?Soft benign +bs ?No edema ?Aaoxox3  ?Mood and affect appropriate in current setting  ? ? ? ?Data Reviewed: I have personally reviewed following labs and imaging studies ? ?CBC: ?Recent Labs  ?Lab  03/08/22 ?2155 03/10/22 ?4627  ?WBC 9.0 6.7  ?HGB 13.6 11.9*  ?HCT 43.4 37.2  ?MCV 83.1 84.0  ?PLT 335 279  ? ?Basic Metabolic Panel: ?Recent Labs  ?Lab 03/08/22 ?2155 03/10/22 ?0350  ?NA 139 138  ?K 3.9 3.6  ?CL 107 108  ?CO2 25 23  ?GLUCOSE 113* 98  ?BUN 8 6  ?CREATININE 0.79 0.61  ?CALCIUM 9.1 8.2*  ? ?GFR: ?Estimated Creatinine Clearance: 100.9 mL/min (by C-G formula based on SCr of 0.61 mg/dL). ?Liver Function Tests: ?Recent Labs  ?Lab 03/08/22 ?2155 03/10/22 ?0938  ?AST 95* 166*  ?ALT 51* 170*  ?ALKPHOS 138* 179*  ?BILITOT 0.9 1.5*  ?PROT 8.1 6.4*  ?ALBUMIN 4.2 3.3*  ? ?Recent Labs  ?Lab 03/08/22 ?2155  ?LIPASE 41  ? ?No results for input(s): AMMONIA in the last 168 hours. ?Coagulation Profile: ?Recent Labs  ?Lab 03/09/22 ?2032  ?INR 1.0  ? ?Cardiac Enzymes: ?No results for input(s): CKTOTAL, CKMB, CKMBINDEX, TROPONINI in the last 168 hours. ?BNP (last 3 results) ?No results for input(s): PROBNP in the last 8760 hours. ?HbA1C: ?No results for input(s): HGBA1C in the last 72 hours. ?CBG: ?Recent Labs  ?Lab 03/10/22 ?1829 03/11/22 ?0750  ?GLUCAP 93 117*  ? ?Lipid Profile: ?No results for input(s): CHOL, HDL, LDLCALC, TRIG, CHOLHDL, LDLDIRECT in the last 72 hours. ?Thyroid Function Tests: ?  No results for input(s): TSH, T4TOTAL, FREET4, T3FREE, THYROIDAB in the last 72 hours. ?Anemia Panel: ?No results for input(s): VITAMINB12, FOLATE, FERRITIN, TIBC, IRON, RETICCTPCT in the last 72 hours. ?Sepsis Labs: ?No results for input(s): PROCALCITON, LATICACIDVEN in the last 168 hours. ? ?No results found for this or any previous visit (from the past 240 hour(s)).  ? ? ? ? ? ?Radiology Studies: ?DG C-Arm 1-60 Min-No Report ? ?Result Date: 03/10/2022 ?Fluoroscopy was utilized by the requesting physician.  No radiographic interpretation.   ? ? ? ? ? ?Scheduled Meds: ? indocyanine green  2.5 mg Intravenous On Call to OR  ? indomethacin  100 mg Rectal Once  ? pantoprazole  40 mg Oral Q1200  ? ?Continuous Infusions: ? sodium  chloride 125 mL/hr at 03/11/22 0548  ? cefTRIAXone (ROCEPHIN)  IV 2 g (03/10/22 1804)  ? ? ?Assessment & Plan: ?  ?Principal Problem: ?  Choledocholithiasis ?Active Problems: ?  Cholelithiasis ?  Morbid obesity with body mass index (BMI) of 40.0 to 44.9 in adult [facility]) ? ? ?Cholelithiasis with choledocholithiasis:   ?Status post MRCP-showed cholelithiasis with Lenon Curt though cholelithiasis with no intra or extrahepatic biliary dilatation ?Right upper quadrant demonstrated cholelithiasis without evidence of acute cholecystitis ?GI and surgery consulted ?3/31 status post ERCP ?Plan for CCK today ?N.p.o. ? ?Morbid obesity with body mass index (BMI) of 40.0 to 44.9 in adult [facility]): BMI 44.29 ?Overall affects prognosis ?3/31 with encouraged to do lifestyle modification and exercise  ? ? ? ? ?DVT prophylaxis: SCD ?Code Status: Full ?Family Communication: None at bedside ?Disposition Plan:  ?Status is: Inpatient ?Remains inpatient appropriate because: Plan for surgery today, needs clearance from surgery for discharge when medically stable ? ? ? ? ? ? ? LOS: 2 days  ? ?Time spent: 35 minutes ? ? ? ?Nolberto Hanlon, MD ?[facility] ?Pager [phone] ? ?If 7PM-7AM, please contact night-coverage ?03/11/2022, 3:34 PM   ?

## 2022-03-11 NOTE — Anesthesia Postprocedure Evaluation (Signed)
Anesthesia Post Note ? ?Patient: Candice Garcia ? ?Procedure(s) Performed: XI ROBOTIC ASSISTED LAPAROSCOPIC CHOLECYSTECTOMY ?INDOCYANINE GREEN FLUORESCENCE IMAGING (ICG) ? ?Patient location during evaluation: PACU ?Anesthesia Type: General ?Level of consciousness: awake and alert ?Pain management: pain level controlled ?Vital Signs Assessment: post-procedure vital signs reviewed and stable ?Respiratory status: spontaneous breathing, nonlabored ventilation and respiratory function stable ?Cardiovascular status: blood pressure returned to baseline and stable ?Postop Assessment: no apparent nausea or vomiting ?Anesthetic complications: no ? ? ?No notable events documented. ? ? ?Last Vitals:  ?Vitals:  ? 03/11/22 1915 03/11/22 2004  ?BP: 124/74 (!) 113/59  ?Pulse: 79 82  ?Resp: (!) 21 18  ?Temp:  37.2 ?C  ?SpO2: 92% 92%  ?  ?Last Pain:  ?Vitals:  ? 03/11/22 1915  ?TempSrc:   ?PainSc: 5   ? ? ?  ?  ?  ?  ?  ?  ? ?[place] Ouch ? ? ? ? ?

## 2022-03-11 NOTE — Transfer of Care (Addendum)
Immediate Anesthesia Transfer of Care Note ? ?Patient: Candice Garcia ? ?Procedure(s) Performed: XI ROBOTIC ASSISTED LAPAROSCOPIC CHOLECYSTECTOMY ?INDOCYANINE GREEN FLUORESCENCE IMAGING (ICG) ? ?Patient Location: PACU ? ?Anesthesia Type:General ? ?Level of Consciousness: awake ? ?Airway & Oxygen Therapy: Patient Spontanous Breathing ? ?Post-op Assessment: Report given to RN ? ?Post vital signs: stable ? ?Last Vitals:  ?Vitals Value Taken Time  ?BP    ?Temp    ?Pulse    ?Resp    ?SpO2    ? ? ?Last Pain:  ?Vitals:  ? 03/11/22 1640  ?TempSrc: Tympanic  ?PainSc: 5   ?   ? ?Patients Stated Pain Goal: 0 (03/11/22 1640) ? ?Complications: No notable events documented. ?

## 2022-03-11 NOTE — Op Note (Signed)
Robotic assisted laparoscopic Cholecystectomy ? ?Pre-operative Diagnosis: Choledocholithiasis and cholecystitis ? ?Post-operative Diagnosis: Same ? ?Procedure:  ?Robotic assisted laparoscopic Cholecystectomy ? ?Surgeon: Caroleen Hamman, MD FACS ? ?Anesthesia: Gen. with endotracheal tube ? ?Findings: ?Acute mild Cholecystitis  ? ?Estimated Blood Loss: 10cc ?      ?Specimens: Gallbladder     ?      ?Complications: none ? ? ?Procedure Details  ?The patient was seen again in the Holding Room. The benefits, complications, treatment options, and expected outcomes were discussed with the patient. The risks of bleeding, infection, recurrence of symptoms, failure to resolve symptoms, bile duct damage, bile duct leak, retained common bile duct stone, bowel injury, any of which could require further surgery and/or ERCP, stent, or papillotomy were reviewed with the patient. The likelihood of improving the patient's symptoms with return to their baseline status is good.  The patient and/or family concurred with the proposed plan, giving informed consent.  The patient was taken to Operating Room, identified  and the procedure verified as Laparoscopic Cholecystectomy. ? A Time Out was held and the above information confirmed. ? ?Prior to the induction of general anesthesia, antibiotic prophylaxis was administered. VTE prophylaxis was in place. General endotracheal anesthesia was then administered and tolerated well. After the induction, the abdomen was prepped with Chloraprep and draped in the sterile fashion. The patient was positioned in the supine position. ? ?Cut down technique was used to enter the abdominal cavity and a Hasson trochar was placed after two vicryl stitches were anchored to the fascia. Pneumoperitoneum was then created with CO2 and tolerated well without any adverse changes in the patient's vital signs.  Three 8-mm ports were placed under direct vision. All skin incisions  were infiltrated with a local anesthetic  agent before making the incision and placing the trocars.  ? ?The patient was positioned  in reverse Trendelenburg, robot was brought to the surgical field and docked in the standard fashion.  We made sure all the instrumentation was kept indirect view at all times and that there were no collision between the arms. ?I scrubbed out and went to the console. ? ?The gallbladder was identified,was found midly inflammed the fundus grasped and retracted cephalad. Adhesions were lysed bluntly. The infundibulum was grasped and retracted laterally, exposing the peritoneum overlying the triangle of Calot. This was then divided and exposed in a blunt fashion. An extended critical view of the cystic duct and cystic artery was obtained.  The cystic duct was clearly identified and bluntly dissected.   Artery and duct were double clipped and divided. ?Using ICG cholangiography we visualize the cystic duct and CBD no evidence of bile injuries observed. ?The gallbladder was taken from the gallbladder fossa in a retrograde fashion with the electrocautery.  ?Hemostasis was achieved with the electrocautery. nspection of the right upper quadrant was performed. No bleeding, bile duct injury or leak, or bowel injury was noted. ?Robotic instruments and robotic arms were undocked in the standard fashion.  I scrubbed back in. ? ?The gallbladder was removed and placed in an Endocatch bag.  ? ?Pneumoperitoneum was released.  The periumbilical port site was closed with interrumpted 0 Vicryl sutures. 4-0 subcuticular Monocryl was used to close the skin. Dermabond was  applied.  The patient was then extubated and brought to the recovery room in stable condition. Sponge, lap, and needle counts were correct at closure and at the conclusion of the case.  ?        ?     ?  Caroleen Hamman, MD, FACS  ?

## 2022-03-11 NOTE — Progress Notes (Signed)
[facility] ?SURGICAL PROGRESS NOTE ? ?Hospital Day(s): 2.  ? ?Post op day(s): 1 Day Post-Op.  ? ?Interval History:  ?Patient seen and examined ?No acute events or new complaints overnight.  ?Patient reports she is doing well; minimal abdominal discomfort ?Some headache; associated with prolonged PO intake ?No fever, chills, nausea, emesis ?No new labs this morning ?She underwent ERCP yesterday (03/30); reviewed ?Plan for robotic assisted laparoscopic cholecystectomy this evening with Dr Dahlia Byes ? ?Vital signs in last 24 hours: [min-max] current  ?Temp:  [97.7 ?F (36.5 ?C)-98.5 ?F (36.9 ?C)] 97.7 ?F (36.5 ?C) (03/31 0750) ?Pulse Rate:  [75-98] 89 (03/31 0750) ?Resp:  [16-20] 16 (03/31 0750) ?BP: (126-183)/(75-97) 142/86 (03/31 0750) ?SpO2:  [92 %-98 %] 93 % (03/31 0750)     Height: 5' 3"  (160 cm) Weight: 113.4 kg BMI (Calculated): 44.3  ? ?Intake/Output last 2 shifts:  ?03/30 0701 - 03/31 0700 ?In: 1586.7 [I.V.:1586.7] ?Out: -   ? ?Physical Exam:  ?Constitutional: alert, cooperative and no distress  ?Respiratory: breathing non-labored at rest  ?Cardiovascular: regular rate and sinus rhythm  ?Gastrointestinal: soft, non-tender, and non-distended ?Integumentary: warm, dry, no juandice  ? ?Labs:  ? ?  Latest Ref Rng & Units 03/10/2022  ?  3:47 AM 03/08/2022  ?  9:55 PM 05/18/2013  ? 10:28 PM  ?CBC  ?WBC 4.0 - 10.5 K/uL 6.7   9.0   10.4    ?Hemoglobin 12.0 - 15.0 g/dL 11.9   13.6   14.1    ?Hematocrit 36.0 - 46.0 % 37.2   43.4   42.1    ?Platelets 150 - 400 K/uL 279   335   298    ? ? ?  Latest Ref Rng & Units 03/10/2022  ?  3:47 AM 03/08/2022  ?  9:55 PM 05/18/2013  ? 10:28 PM  ?CMP  ?Glucose 70 - 99 mg/dL 98   113   101    ?BUN 6 - 20 mg/dL 6   8   7     ?Creatinine 0.44 - 1.00 mg/dL 0.61   0.79   0.74    ?Sodium 135 - 145 mmol/L 138   139   139    ?Potassium 3.5 - 5.1 mmol/L 3.6   3.9   3.6    ?Chloride 98 - 111 mmol/L 108   107   110    ?CO2 22 - 32 mmol/L 23   25   22     ?Calcium 8.9 - 10.3 mg/dL 8.2   9.1    8.6    ?Total Protein 6.5 - 8.1 g/dL 6.4   8.1   7.4    ?Total Bilirubin 0.3 - 1.2 mg/dL 1.5   0.9   0.4    ?Alkaline Phos 38 - 126 U/L 179   138   111    ?AST 15 - 41 U/L 166   95   23    ?ALT 0 - 44 U/L 170   51   26    ? ? ? ?Imaging studies: No new pertinent imaging studies ? ? ?Assessment/Plan: (ICD-10's: K80.50) ?51 y.o. female with choledocholithiasis without cholecystitis  ?  ?          - Appreciate GI assistance; ERCP on 03/30; reviewed    ?- Plan for robotic assisted laparoscopic cholecystectomy this evening with Dr Dahlia Byes pending OR/Anesthesia availability   ?- All risks, benefits, and alternatives to above  procedure(s) were discussed with the patient, all of her questions  were answered to her expressed satisfaction, patient expresses she wishes to proceed, and informed consent was obtained.  ?           - NPO for OR ?- IVF resuscitation ?- Monitor abdominal examination ?- Pain control prn; antiemetics prn ?            - Monitor LFTs ?           - Mobilization as tolerated  ?           - Further management per primary service; we will follow  ? ?All of the above findings and recommendations were discussed with the patient, and the medical team, and all of patient's questions were answered to her expressed satisfaction. ? ?-- ?Edison Simon, PA-C ?[facility] ?03/11/2022, 8:27 AM ?[phone] ?M-F: 7am - 4pm ? ?

## 2022-03-12 LAB — CBC
HCT: 37.4 % (ref 36.0–46.0)
Hemoglobin: 12.2 g/dL (ref 12.0–15.0)
MCH: 26.9 pg (ref 26.0–34.0)
MCHC: 32.6 g/dL (ref 30.0–36.0)
MCV: 82.4 fL (ref 80.0–100.0)
Platelets: 313 10*3/uL (ref 150–400)
RBC: 4.54 MIL/uL (ref 3.87–5.11)
RDW: 14.9 % (ref 11.5–15.5)
WBC: 12.1 10*3/uL — ABNORMAL HIGH (ref 4.0–10.5)
nRBC: 0 % (ref 0.0–0.2)

## 2022-03-12 LAB — BASIC METABOLIC PANEL
Anion gap: 9 (ref 5–15)
BUN: 9 mg/dL (ref 6–20)
CO2: 23 mmol/L (ref 22–32)
Calcium: 8.9 mg/dL (ref 8.9–10.3)
Chloride: 107 mmol/L (ref 98–111)
Creatinine, Ser: 0.66 mg/dL (ref 0.44–1.00)
GFR, Estimated: >60 mL/min (ref >60)
Glucose, Bld: 154 mg/dL — ABNORMAL HIGH (ref 70–99)
Potassium: 3.4 mmol/L — ABNORMAL LOW (ref 3.5–5.1)
Sodium: 139 mmol/L (ref 135–145)

## 2022-03-12 MED ORDER — AMOXICILLIN-POT CLAVULANATE 875-125 MG PO TABS: 1.0000 | ORAL_TABLET | Freq: Two times a day (BID) | ORAL | 0 refills | Status: AC

## 2022-03-12 MED ORDER — POTASSIUM CHLORIDE CRYS ER 20 MEQ PO TBCR
40.0000 meq | EXTENDED_RELEASE_TABLET | Freq: Once | ORAL | Status: AC
  Administered 2022-03-12: 40 meq via ORAL
  Filled 2022-03-12: qty 2

## 2022-03-12 MED ORDER — PROCHLORPERAZINE MALEATE 10 MG PO TABS: 10.0000 mg | ORAL_TABLET | Freq: Three times a day (TID) | ORAL | 0 refills | Status: AC | PRN

## 2022-03-12 MED ORDER — OXYCODONE HCL 5 MG PO TABS: 5.0000 mg | ORAL_TABLET | ORAL | 0 refills | Status: AC | PRN

## 2022-03-12 NOTE — Discharge Instructions (Signed)

## 2022-03-12 NOTE — Discharge Summary (Addendum)
Candice Garcia:[id number] DOB: October 23, 1971 DOA: 03/08/2022 ? ?PCP: Pcp, No ? ?Admit date: 03/08/2022 ?Discharge date: 03/12/2022 ? ?Admitted From: home ?Disposition:  home ? ?Recommendations for Outpatient Follow-up:  ?Follow up with PCP in 1 week ?Please obtain BMP/CBC in one week ?Please follow up surgery in 2 weeks ? ? ? ? ?Discharge Condition:Stable ?CODE STATUS:full  ?Diet recommendation: Heart Healthy-soft and advance as tolerated  ? ?Brief/Interim Summary: ?Per HPI: Candice Garcia is a 51 y.o. female with medical history significant of obesity with BMI 44.29, who presents with abdominal pain.Data Reviewed and ED Course: pt was found to have WBC 9.0, troponin level 4 --> 5, lipase 41, liver function (ALP 138, AST 95, ALT 51, total bilirubin 0.4), temperature normal, blood pressure 130/85, heart rate 94, 88, RR 30, 16, oxygen saturation 90-99% on room air.  Chest x-ray negative.  RUQ-ultrasound showed cholelithiasis without signs of cholecystitis.  MRCP showed cholelithiasis with choledocholithiasis without ductal dilation.  Patient is admitted to [place] bed as inpatient.  Dr. Vicente Males of GI and Dr. Dahlia Byes of surgery are consulted. ? ? Cholelithiasis with choledocholithiasis:   ?Status post MRCP-showed cholelithiasis with Lenon Curt though cholelithiasis with no intra or extrahepatic biliary dilatation ?Right upper quadrant demonstrated cholelithiasis without evidence of acute cholecystitis ?GI and surgery consulted ?status post ERCP for choledocholithiasis by Dr. Eliberto Ivory ?S/p cholecystectomy by Dr. Dahlia Byes on 3/31 ?Tolerated soft diet which she needs to continue and increase as tolerated.  She is passing gas.  Surgery cleared her for discharge via message ?  ?Morbid obesity with body mass index (BMI) of 40.0 to 44.9 in adult [facility]): BMI 44.29 ?Overall affects prognosis ?Encouraged to do lifestyle modification and exercise  ? ?Discharge Diagnoses:  ?Principal Problem: ?  Choledocholithiasis ?Active Problems: ?   Cholelithiasis ?  Morbid obesity with body mass index (BMI) of 40.0 to 44.9 in adult [facility]) ?  S/P repair of paraesophageal hernia ? ? ? ?Discharge Instructions ? ?Discharge Instructions   ? ? Call MD for:  redness, tenderness, or signs of infection (pain, swelling, redness, odor or green/yellow discharge around incision site)   Complete by: As directed ?  ? Call MD for:  temperature >100.4   Complete by: As directed ?  ? Diet - low sodium heart healthy   Complete by: As directed ?  ? Discharge instructions   Complete by: As directed ?  ? F/u with DR. Pabon in one week ?Continue soft diet and advance tomorrow if you can tolerate ?Avoid fatty or greasy food  ? Increase activity slowly   Complete by: As directed ?  ? No wound care   Complete by: As directed ?  ? ?  ? ?Allergies as of 03/12/2022   ?No Known Allergies ?  ? ?  ?Medication List  ?  ? ?TAKE these medications   ? ?amoxicillin-clavulanate 875-125 MG tablet ?Commonly known as: Augmentin ?Take 1 tablet by mouth every 12 (twelve) hours for 8 days. ?  ?oxyCODONE 5 MG immediate release tablet ?Commonly known as: Oxy IR/ROXICODONE ?Take 1 tablet (5 mg total) by mouth every 4 (four) hours as needed for up to 3 days for moderate pain. ?  ?prochlorperazine 10 MG tablet ?Commonly known as: COMPAZINE ?Take 1 tablet (10 mg total) by mouth every 8 (eight) hours as needed for up to 3 days for nausea or vomiting (Use for nausea and / or vomiting unresolved with ondansetron (Zofran).). ?  ? ?  ? ? Follow-up Information   ? ?  Edison Simon R, PA-C Follow up on 03/24/2022.   ?Specialty: Physician Assistant ?Contact information: ?[address] ?[address] ?[place] [address] ?[phone] ? ? ?  ?  ? ?  ?  ? ?  ? ?No Known Allergies ? ?Consultations: ?GI and surgery ? ? ?Procedures/Studies: ?DG Chest 2 View ? ?Result Date: 03/08/2022 ?CLINICAL DATA:  Midsternal chest pain for 2 hours EXAM: CHEST - 2 VIEW COMPARISON:  None. FINDINGS: The heart size and mediastinal contours are  within normal limits. Both lungs are clear. The visualized skeletal structures are unremarkable. IMPRESSION: No active cardiopulmonary disease. Electronically Signed   By: Randa Ngo M.D.   On: 03/08/2022 22:13  ? ?MR ABDOMEN MRCP WO CONTRAST ? ?Result Date: 03/09/2022 ?CLINICAL DATA:  Cholelithiasis.  Upper abdominal pain. EXAM: MRI ABDOMEN WITHOUT CONTRAST  (INCLUDING MRCP) TECHNIQUE: Multiplanar multisequence MR imaging of the abdomen was performed. Heavily T2-weighted images of the biliary and pancreatic ducts were obtained, and three-dimensional MRCP images were rendered by post processing. COMPARISON:  Ultrasound exam earlier same day FINDINGS: Lower chest: Basilar atelectasis noted bilaterally. Hepatobiliary: No suspicious focal abnormality in the liver on this study without intravenous contrast. Fine detail of liver parenchyma obscured by breathing motion. Multiple gallstones are evident ranging in size from 1-2 mm up to a dominant 17 mm stone. No evidence for gallbladder wall thickening or pericholecystic fluid. No intra or extrahepatic biliary duct dilatation with common bile duct measuring 6 mm diameter in the head of the pancreas. 1-2 mm stone is identified in the distal common bile duct (well seen on coronal haste image 16 of series 3 and coronal thin MIP image 5 of series 15). Pancreas: No focal mass lesion. No dilatation of the main duct. No intraparenchymal cyst. No peripancreatic edema. Spleen:  No splenomegaly. No focal mass lesion. Adrenals/Urinary Tract: No adrenal nodule or mass. Kidneys unremarkable. Stomach/Bowel: Stomach is unremarkable. No gastric wall thickening. No evidence of outlet obstruction. Duodenum is normally positioned as is the ligament of Treitz. No small bowel or colonic dilatation within the visualized abdomen. Vascular/Lymphatic: No abdominal aortic aneurysm. No abdominal lymphadenopathy. Other:  No intraperitoneal free fluid. Musculoskeletal: No suspicious abnormal marrow  signal evident. IMPRESSION: Cholelithiasis with choledocholithiasis. No intra or extrahepatic biliary duct dilatation. Electronically Signed   By: Misty Stanley M.D.   On: 03/09/2022 05:07  ? ?DG C-Arm 1-60 Min-No Report ? ?Result Date: 03/10/2022 ?Fluoroscopy was utilized by the requesting physician.  No radiographic interpretation.  ? ?US ABDOMEN LIMITED RUQ (LIVER/GB) ? ?Result Date: 03/09/2022 ?CLINICAL DATA:  Right upper quadrant abdominal pain. EXAM: ULTRASOUND ABDOMEN LIMITED RIGHT UPPER QUADRANT COMPARISON:  None. FINDINGS: Gallbladder: There multiple stones within the gallbladder. No gallbladder wall thickening or pericholecystic fluid. Negative sonographic Murphy's sign. Common bile duct: Diameter: 3 mm Liver: No focal lesion identified. Within normal limits in parenchymal echogenicity. Portal vein is patent on color Doppler imaging with normal direction of blood flow towards the liver. Other: None. IMPRESSION: Cholelithiasis without sonographic evidence of acute cholecystitis. Electronically Signed   By: Anner Crete M.D.   On: 03/09/2022 02:23   ? ? ? ?Subjective: ? ? ?Discharge Exam: ?Vitals:  ? 03/12/22 0748 03/12/22 1529  ?BP: 136/71 131/70  ?Pulse: 83 79  ?Resp: 18 19  ?Temp: 98.1 ?F (36.7 ?C) 98.6 ?F (37 ?C)  ?SpO2: 94% 94%  ? ?Vitals:  ? 03/11/22 2109 03/12/22 0314 03/12/22 0748 03/12/22 1529  ?BP: (!) 123/58 134/85 136/71 131/70  ?Pulse: 78 75 83 79  ?Resp: 17  18 18 19   ?Temp: 98.4 ?F (36.9 ?C) 98.5 ?F (36.9 ?C) 98.1 ?F (36.7 ?C) 98.6 ?F (37 ?C)  ?TempSrc:    Oral  ?SpO2: 94% 93% 94% 94%  ?Weight:      ?Height:      ? ? ?General: Pt is alert, awake, not in acute distress ?Cardiovascular: RRR, S1/S2 +, no rubs, no gallops ?Respiratory: CTA bilaterally, no wheezing, no rhonchi ?Abdominal: Soft, NT, ND, bowel sounds + ?Extremities: no edema, no cyanosis ? ? ? ?The results of significant diagnostics from this hospitalization (including imaging, microbiology, ancillary and laboratory) are listed  below for reference.   ? ? ?Microbiology: ?No results found for this or any previous visit (from the past 240 hour(s)).  ? ?Labs: ?BNP (last 3 results) ?No results for input(s): BNP in the last 8760 hours. ?Basic Metabol

## 2022-03-12 NOTE — Progress Notes (Signed)
Candice Garcia to be D/C'd Home per MD order.  Discussed prescriptions and follow up appointments with the patient. Prescriptions given to patient, medication list explained in detail. Pt verbalized understanding. ? ?Allergies as of 03/12/2022   ?No Known Allergies ?  ? ?  ?Medication List  ?  ? ?TAKE these medications   ? ?oxyCODONE 5 MG immediate release tablet ?Commonly known as: Oxy IR/ROXICODONE ?Take 1 tablet (5 mg total) by mouth every 4 (four) hours as needed for up to 3 days for moderate pain. ?  ?prochlorperazine 10 MG tablet ?Commonly known as: COMPAZINE ?Take 1 tablet (10 mg total) by mouth every 8 (eight) hours as needed for up to 3 days for nausea or vomiting (Use for nausea and / or vomiting unresolved with ondansetron (Zofran).). ?  ? ?  ? ? ?Vitals:  ? 03/12/22 0748 03/12/22 1529  ?BP: 136/71 131/70  ?Pulse: 83 79  ?Resp: 18 19  ?Temp: 98.1 ?F (36.7 ?C) 98.6 ?F (37 ?C)  ?SpO2: 94% 94%  ? ? ?Skin clean, dry and intact without evidence of skin break down, no evidence of skin tears noted. IV catheter discontinued intact. Site without signs and symptoms of complications. Dressing and pressure applied. Pt denies pain at this time. No complaints noted. ? ?An After Visit Summary was printed and given to the patient. ?Patient escorted via [place], and D/C home via private auto. ? ?Candice Garcia  ?

## 2022-03-15 LAB — SURGICAL PATHOLOGY

## 2022-03-24 ENCOUNTER — Encounter: Payer: Self-pay | Admitting: Physician Assistant

## 2022-03-24 ENCOUNTER — Ambulatory Visit (INDEPENDENT_AMBULATORY_CARE_PROVIDER_SITE_OTHER): Payer: Self-pay | Admitting: Physician Assistant

## 2022-03-24 VITALS — BP 141/88 | HR 81 | Temp 98.3°F | Ht 63.0 in | Wt 252.0 lb

## 2022-03-24 DIAGNOSIS — K8062 Calculus of gallbladder and bile duct with acute cholecystitis without obstruction: Secondary | ICD-10-CM

## 2022-03-24 DIAGNOSIS — Z09 Encounter for follow-up examination after completed treatment for conditions other than malignant neoplasm: Secondary | ICD-10-CM

## 2022-03-24 DIAGNOSIS — K8050 Calculus of bile duct without cholangitis or cholecystitis without obstruction: Secondary | ICD-10-CM

## 2022-03-24 NOTE — Patient Instructions (Addendum)
If you have any concerns or questions, please feel free to call our office. See follow up appointment below.  ? ? ?GENERAL POST-OPERATIVE ?PATIENT INSTRUCTIONS  ? ?WOUND CARE INSTRUCTIONS:  Keep a dry clean dressing on the wound if there is drainage. The initial bandage may be removed after 24 hours.  Once the wound has quit draining you may leave it open to air.  If clothing rubs against the wound or causes irritation and the wound is not draining you may cover it with a dry dressing during the daytime.  Try to keep the wound dry and avoid ointments on the wound unless directed to do so.  If the wound becomes bright red and painful or starts to drain infected material that is not clear, please contact your physician immediately.  If the wound is mildly pink and has a thick firm ridge underneath it, this is normal, and is referred to as a healing ridge.  This will resolve over the next 4-6 weeks. ? ?BATHING: ?You may shower if you have been informed of this by your surgeon. However, Please do not submerge in a tub, hot tub, or pool until incisions are completely sealed or have been told by your surgeon that you may do so. ? ?DIET:  You may eat any foods that you can tolerate.  It is a good idea to eat a high fiber diet and take in plenty of fluids to prevent constipation.  If you do become constipated you may want to take a mild laxative or take ducolax tablets on a daily basis until your bowel habits are regular.  Constipation can be very uncomfortable, along with straining, after recent surgery. ? ?ACTIVITY:  You are encouraged to cough and deep breath or use your incentive spirometer if you were given one, every 15-30 minutes when awake.  This will help prevent respiratory complications and low grade fevers post-operatively if you had a general anesthetic.  You may want to hug a pillow when coughing and sneezing to add additional support to the surgical area, if you had abdominal or chest surgery, which will  decrease pain during these times.  You are encouraged to walk and engage in light activity for the next two weeks.  You should not lift more than 20 pounds for 6 weeks total after surgery as it could put you at increased risk for complications.  Twenty pounds is roughly equivalent to a plastic bag of groceries. At that time- Listen to your body when lifting, if you have pain when lifting, stop and then try again in a few days. Soreness after doing exercises or activities of daily living is normal as you get back in to your normal routine. ? ?MEDICATIONS:  Try to take narcotic medications and anti-inflammatory medications, such as tylenol, ibuprofen, naprosyn, etc., with food.  This will minimize stomach upset from the medication.  Should you develop nausea and vomiting from the pain medication, or develop a rash, please discontinue the medication and contact your physician.  You should not drive, make important decisions, or operate machinery when taking narcotic pain medication. ? ?SUNBLOCK ?Use sun block to incision area over the next year if this area will be exposed to sun. This helps decrease scarring and will allow you avoid a permanent darkened area over your incision. ? ?QUESTIONS:  Please feel free to call our office if you have any questions, and we will be glad to assist you. [phone] ? ? ?

## 2022-03-24 NOTE — Progress Notes (Signed)
[facility] ?POST-OP OFFICE VISIT ? ?03/24/2022 ? ?HPI: ?Candice Garcia is a 51 y.o. female 13 days s/p robotic assisted laparoscopic cholecystectomy for choledocholithiasis and cholecystitis with Dr Dahlia Byes ? ?She is overall doing well ?Had about 3 days of deeper abdominal soreness ?No fever, chills, nausea, emesis ?She is having some diarrhea but attributes this to her diet. She is trying her best to avoid fatty foods ?No issues with incisions ? ?Vital signs: ?BP (!) 141/88   Pulse 81   Temp 98.3 ?F (36.8 ?C)   Ht 5' 3"  (1.6 m)   Wt 252 lb (114.3 kg)   SpO2 98%   BMI 44.64 kg/m?   ? ?Physical Exam: ?Constitutional: Well appearing female, NAD ?Abdomen: Soft,  very mild tenderness to the RUQ, non-distended, no rebound/guarding ?Skin: Laparoscopic incisions are healing well, no erythema or drainage  ? ?Assessment/Plan: ?This is a 51 y.o. female 13 days s/p robotic assisted laparoscopic cholecystectomy for choledocholithiasis and cholecystitis  ? ? - Pain control prn ? - Reviewed wound care recommendation ? - Reviewed lifting restrictions; 4 weeks total ? - Reviewed surgical pathology; [place] ? - She will follow up in 2 weeks  ? ?-- ?Edison Simon, PA-C ?[facility] ?03/24/2022, 2:24 PM ?M-F: 7am - 4pm ? ?

## 2022-04-07 ENCOUNTER — Ambulatory Visit (INDEPENDENT_AMBULATORY_CARE_PROVIDER_SITE_OTHER): Payer: Self-pay | Admitting: Physician Assistant

## 2022-04-07 ENCOUNTER — Encounter: Payer: Self-pay | Admitting: Physician Assistant

## 2022-04-07 VITALS — BP 117/71 | HR 75 | Temp 98.1°F | Ht 63.0 in | Wt 246.0 lb

## 2022-04-07 DIAGNOSIS — K8062 Calculus of gallbladder and bile duct with acute cholecystitis without obstruction: Secondary | ICD-10-CM

## 2022-04-07 DIAGNOSIS — K8050 Calculus of bile duct without cholangitis or cholecystitis without obstruction: Secondary | ICD-10-CM

## 2022-04-07 DIAGNOSIS — Z09 Encounter for follow-up examination after completed treatment for conditions other than malignant neoplasm: Secondary | ICD-10-CM

## 2022-04-07 NOTE — Progress Notes (Signed)
[facility] ?POST-OP OFFICE VISIT ? ?04/07/2022 ? ?HPI: ?Candice Garcia is a 51 y.o. female 27 days s/p robotic assisted laparoscopic cholecystectomy for choledocholithiasis and cholecystitis ? ?She continues to do well ?No abdominal pain, nausea, emesis ?She has some diarrhea with fatty foods ?No issues with her incisions ?No other complaints  ? ?Vital signs: ?BP 117/71   Pulse 75   Temp 98.1 ?F (36.7 ?C)   Ht 5' 3"  (1.6 m)   Wt 246 lb (111.6 kg)   SpO2 96%   BMI 43.58 kg/m?   ? ?Physical Exam: ?Constitutional: Well appearing female, NAD ?Abdomen: Soft, non-tender, non-distended, no rebound/guarding ?Skin: Laparoscopic incisions are healing well, no erythema or drainage. She has a small Monocryl suture sticking out of one of her incisions which I offered to cut for her but she will just let it breakdown on its own  ? ?Assessment/Plan: ?This is a 51 y.o. female 27 days s/p robotic assisted laparoscopic cholecystectomy for choledocholithiasis and cholecystitis ? ? - Pain control prn ? - Reviewed lifting restrictions; she will have completed these tomorrow  ? - She can follow up on as needed basis; She understands to call with questions/concerns ? ?-- ?Edison Simon, PA-C ? [facility] ?04/07/2022, 2:17 PM ?M-F: 7am - 4pm ? ?

## 2022-04-07 NOTE — Patient Instructions (Signed)

## 2022-09-27 ENCOUNTER — Other Ambulatory Visit: Payer: Self-pay | Admitting: Family Medicine

## 2022-09-27 DIAGNOSIS — Z1231 Encounter for screening mammogram for malignant neoplasm of breast: Secondary | ICD-10-CM

## 2022-09-29 ENCOUNTER — Other Ambulatory Visit: Payer: Self-pay | Admitting: Family Medicine

## 2022-09-29 DIAGNOSIS — R7989 Other specified abnormal findings of blood chemistry: Secondary | ICD-10-CM

## 2022-10-05 ENCOUNTER — Ambulatory Visit
Admission: RE | Admit: 2022-10-05 | Discharge: 2022-10-05 | Disposition: A | Discharge status: Home / Self Care | Payer: Managed Care, Other (non HMO) | Source: Ambulatory Visit | Attending: Family Medicine | Admitting: Family Medicine

## 2022-10-05 DIAGNOSIS — R7989 Other specified abnormal findings of blood chemistry: Secondary | ICD-10-CM

## 2022-12-08 ENCOUNTER — Other Ambulatory Visit: Payer: Self-pay

## 2022-12-08 ENCOUNTER — Ambulatory Visit
Admission: RE | Admit: 2022-12-08 | Discharge: 2022-12-08 | Disposition: A | Discharge status: Home / Self Care | Payer: Managed Care, Other (non HMO) | Source: Ambulatory Visit | Attending: Family Medicine | Admitting: Family Medicine

## 2022-12-08 DIAGNOSIS — Z1231 Encounter for screening mammogram for malignant neoplasm of breast: Secondary | ICD-10-CM | POA: Insufficient documentation

## 2022-12-08 MED ORDER — VALACYCLOVIR HCL 500 MG PO TABS
500.0000 mg | ORAL_TABLET | Freq: Two times a day (BID) | ORAL | 6 refills | Status: AC
  Filled 2022-12-08: qty 12, 6d supply, fill #0
  Filled 2022-12-14 – 2023-06-01 (×2): qty 12, 6d supply, fill #1

## 2022-12-08 MED ORDER — AMOXICILLIN-POT CLAVULANATE 875-125 MG PO TABS
1.0000 | ORAL_TABLET | Freq: Two times a day (BID) | ORAL | 0 refills | Status: AC
  Filled 2022-12-08: qty 14, 7d supply, fill #0

## 2022-12-14 ENCOUNTER — Inpatient Hospital Stay
Admission: RE | Admit: 2022-12-14 | Discharge: 2022-12-14 | Disposition: A | Discharge status: Home / Self Care | Payer: Self-pay | Source: Ambulatory Visit | Attending: *Deleted | Admitting: *Deleted

## 2022-12-14 ENCOUNTER — Other Ambulatory Visit: Payer: Self-pay | Admitting: *Deleted

## 2022-12-14 ENCOUNTER — Other Ambulatory Visit: Payer: Self-pay

## 2022-12-14 DIAGNOSIS — Z1231 Encounter for screening mammogram for malignant neoplasm of breast: Secondary | ICD-10-CM

## 2022-12-14 MED ORDER — PHENDIMETRAZINE TARTRATE 35 MG PO TABS
1.0000 | ORAL_TABLET | Freq: Three times a day (TID) | ORAL | 0 refills | Status: AC
  Filled 2022-12-14: qty 90, 30d supply, fill #0

## 2022-12-15 ENCOUNTER — Other Ambulatory Visit: Payer: Self-pay

## 2022-12-21 ENCOUNTER — Other Ambulatory Visit: Payer: Self-pay

## 2023-01-13 ENCOUNTER — Other Ambulatory Visit: Payer: Self-pay

## 2023-01-19 ENCOUNTER — Other Ambulatory Visit: Payer: Self-pay

## 2023-01-19 MED ORDER — PHENDIMETRAZINE TARTRATE ER 105 MG PO CP24
105.0000 mg | ORAL_CAPSULE | Freq: Every morning | ORAL | 0 refills | Status: AC
  Filled 2023-01-19 (×5): qty 30, 30d supply, fill #0

## 2023-01-20 ENCOUNTER — Other Ambulatory Visit: Payer: Self-pay

## 2023-01-23 ENCOUNTER — Other Ambulatory Visit: Payer: Self-pay

## 2023-01-23 MED ORDER — AZITHROMYCIN 250 MG PO TABS
ORAL_TABLET | ORAL | 0 refills | Status: AC
  Filled 2023-01-23: qty 6, 5d supply, fill #0

## 2023-01-25 ENCOUNTER — Other Ambulatory Visit: Payer: Self-pay

## 2023-01-25 MED ORDER — BENZONATATE 200 MG PO CAPS
200.0000 mg | ORAL_CAPSULE | Freq: Three times a day (TID) | ORAL | 0 refills | Status: AC | PRN
  Filled 2023-01-25: qty 20, 7d supply, fill #0

## 2023-02-20 ENCOUNTER — Other Ambulatory Visit: Payer: Self-pay

## 2023-02-20 MED ORDER — PHENTERMINE HCL 37.5 MG PO TABS
37.5000 mg | ORAL_TABLET | Freq: Every day | ORAL | 0 refills | Status: DC
  Filled 2023-02-20: qty 30, 30d supply, fill #0

## 2023-03-14 ENCOUNTER — Other Ambulatory Visit: Payer: Self-pay

## 2023-03-14 MED ORDER — AMOXICILLIN-POT CLAVULANATE 875-125 MG PO TABS
1.0000 | ORAL_TABLET | Freq: Two times a day (BID) | ORAL | 0 refills | Status: AC
  Filled 2023-03-14: qty 20, 10d supply, fill #0

## 2023-03-14 MED ORDER — PREDNISONE 10 MG PO TABS
ORAL_TABLET | ORAL | 0 refills | Status: AC
  Filled 2023-03-14: qty 20, 8d supply, fill #0

## 2023-03-14 MED ORDER — HYDROXYZINE HCL 25 MG PO TABS
25.0000 mg | ORAL_TABLET | Freq: Three times a day (TID) | ORAL | 0 refills | Status: AC | PRN
  Filled 2023-03-14: qty 30, 10d supply, fill #0

## 2023-03-20 ENCOUNTER — Other Ambulatory Visit: Payer: Self-pay

## 2023-03-20 MED ORDER — HYDROCHLOROTHIAZIDE 25 MG PO TABS
25.0000 mg | ORAL_TABLET | Freq: Every day | ORAL | 0 refills | Status: DC
  Filled 2023-03-20: qty 30, 30d supply, fill #0

## 2023-03-22 ENCOUNTER — Other Ambulatory Visit: Payer: Self-pay

## 2023-03-22 MED ORDER — PHENTERMINE HCL 37.5 MG PO TABS
37.5000 mg | ORAL_TABLET | Freq: Every day | ORAL | 0 refills | Status: DC
  Filled 2023-03-22: qty 30, 30d supply, fill #0

## 2023-03-27 ENCOUNTER — Other Ambulatory Visit: Payer: Self-pay

## 2023-03-27 MED ORDER — FLUCONAZOLE 150 MG PO TABS
ORAL_TABLET | ORAL | 1 refills | Status: AC
  Filled 2023-03-27: qty 1, 1d supply, fill #0
  Filled 2023-04-12: qty 1, 1d supply, fill #1

## 2023-03-29 ENCOUNTER — Other Ambulatory Visit: Payer: Self-pay

## 2023-03-29 MED ORDER — VENLAFAXINE HCL 37.5 MG PO TABS
37.5000 mg | ORAL_TABLET | Freq: Two times a day (BID) | ORAL | 0 refills | Status: DC
  Filled 2023-03-29: qty 60, 30d supply, fill #0

## 2023-03-31 IMAGING — MR MR MRCP
10 of 11 series · 43 of 48 positions shown · non-contrast
Comparison: Ultrasound exam earlier same day

CLINICAL DATA: Cholelithiasis.  Upper abdominal pain.

EXAM:
MRI ABDOMEN WITHOUT CONTRAST  (INCLUDING MRCP)
TECHNIQUE: Multiplanar multisequence MR imaging of the abdomen was performed.
Heavily T2-weighted images of the biliary and pancreatic ducts were
obtained, and three-dimensional MRCP images were rendered by post
processing.

[Series 3: T2 · coronal · 6.0mm · 1.19mm/px · 2 of 35 slices shown (1 of 2)]
[im 1/35]
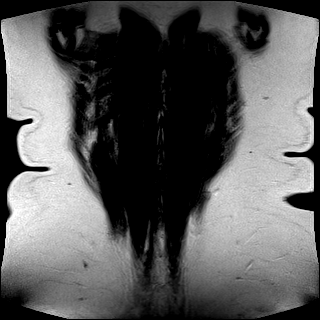
[im 35/35]
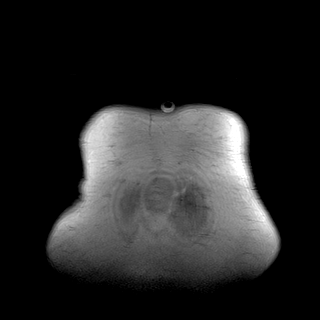

[Series 4: T2 · axial · 6.0mm · 1.19mm/px · z∈[-129,+137]mm · 3 of 38 slices shown (2 of 2)]
[im 1/38]
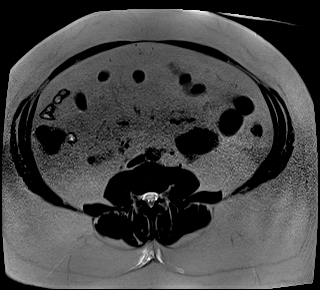
[im 19/38]
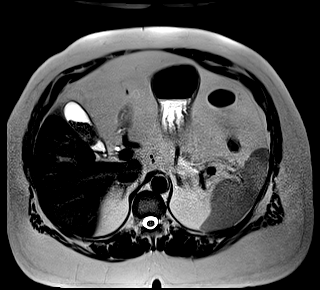
[im 38/38]
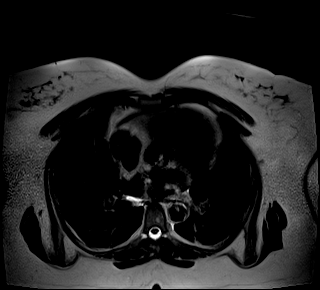

[Series 5: in & out · axial · 3.0mm · 1.19mm/px · z∈[-138,+147]mm · 7 of 96 slices shown (1 of 2)]
[im 1/96]
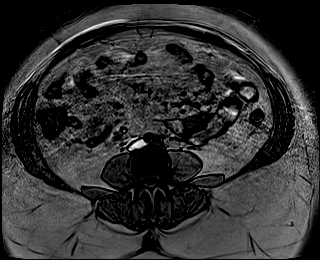
[im 16/96]
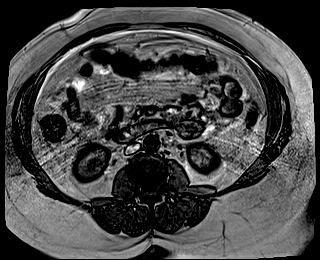
[im 32/96]
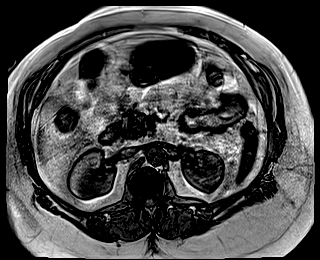
[im 48/96]
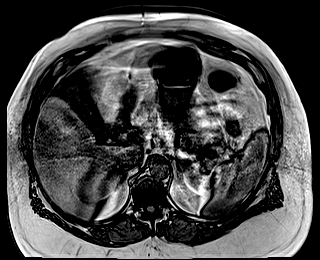
[im 64/96]
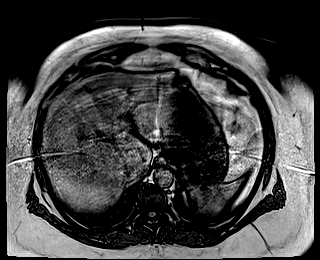
[im 80/96]
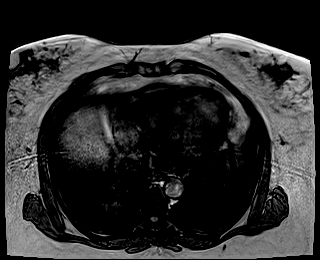
[im 96/96]
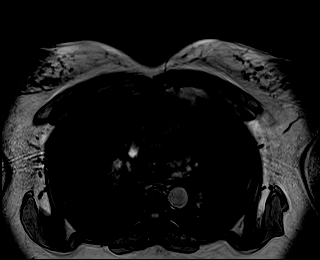

[Series 6: in & out · axial · 3.0mm · 1.19mm/px · z∈[-138,+147]mm · 7 of 96 slices shown (2 of 2)]
[im 1/96]
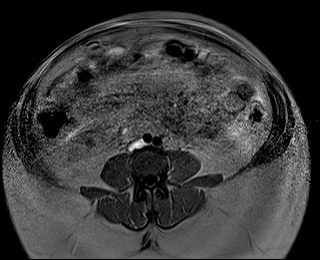
[im 16/96]
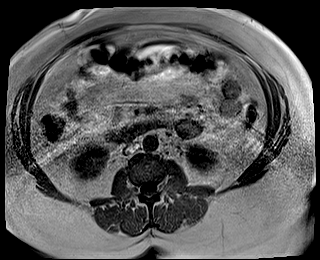
[im 32/96]
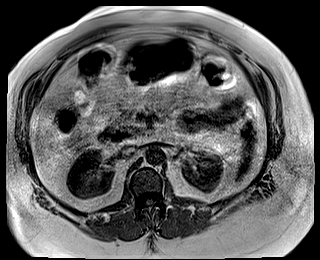
[im 48/96]
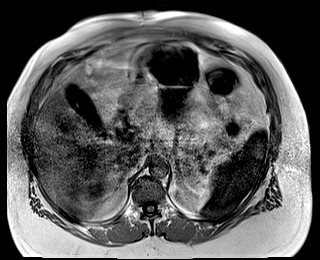
[im 64/96]
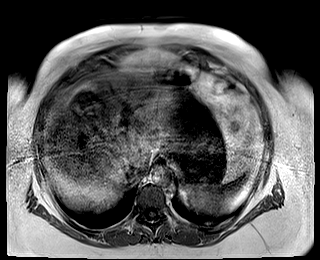
[im 80/96]
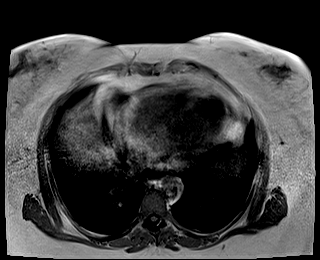
[im 96/96]
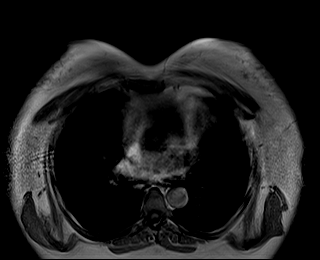

[Series 9: T2 fat-sat · axial · 6.0mm · 1.19mm/px · z∈[-129,+137]mm · 3 of 38 slices shown]
[im 1/38]
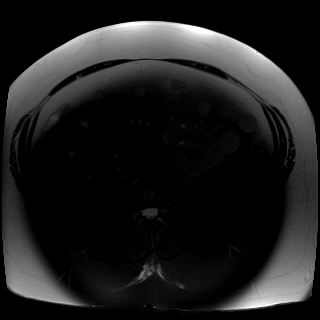
[im 19/38]
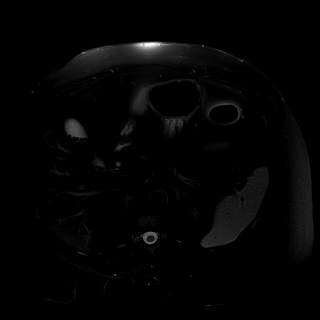
[im 38/38]
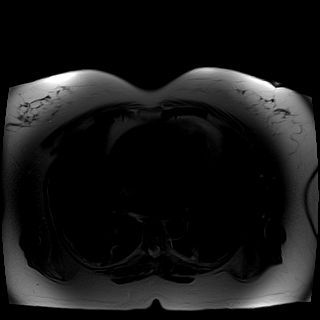

[Series 10: ax dwi_tracew · axial · 6.0mm · 1.42mm/px · z∈[-129,+137]mm · 9 of 114 slices shown]
[im 1/114]
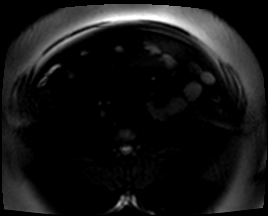
[im 15/114]
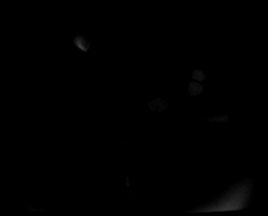
[im 29/114]
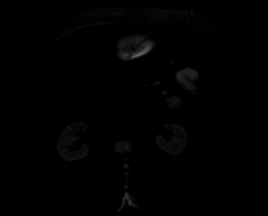
[im 43/114]
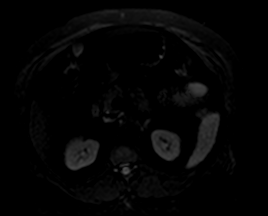
[im 57/114]
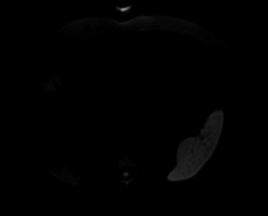
[im 71/114]
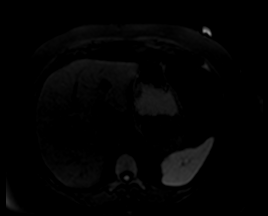
[im 85/114]
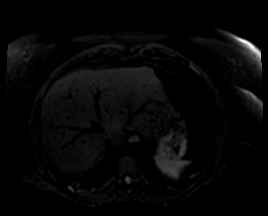
[im 99/114]
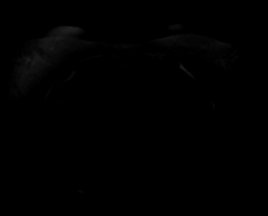
[im 114/114]
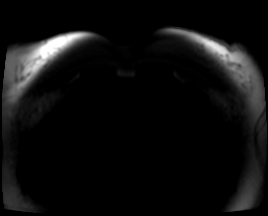

[Series 11: ax dwi_adc · axial · 6.0mm · 1.42mm/px · z∈[-129,+137]mm · 3 of 38 slices shown]
[im 1/38]
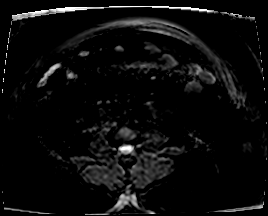
[im 19/38]
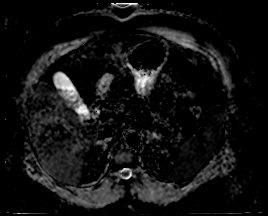
[im 38/38]
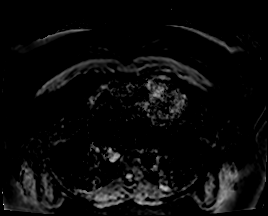

[Series 15: MRCP · coronal · 3.0mm · 1.12mm/px · 1 of 17 slices shown]
[im 1/17]
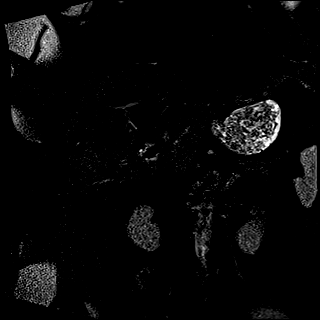

[Series 16: radials · coronal · 50.0mm · 0.78mm/px · 1 of 5 slices shown]
[im 1/5]
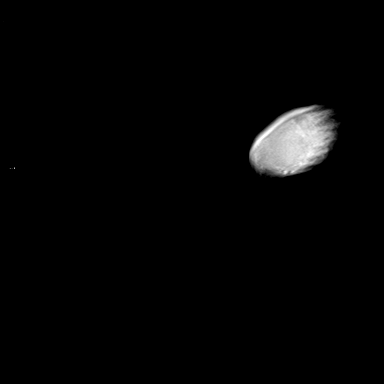

[Series 17: T1 dynamic fat-sat · axial · non-contrast · 3.0mm · 1.19mm/px · z∈[-126,+135]mm · 7 of 88 slices shown]
[im 1/88]
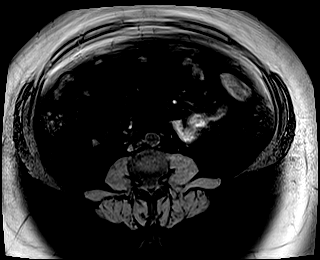
[im 15/88]
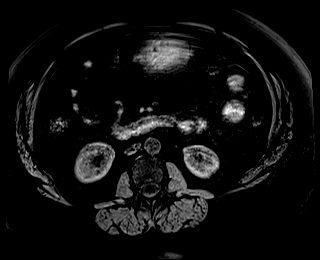
[im 30/88]
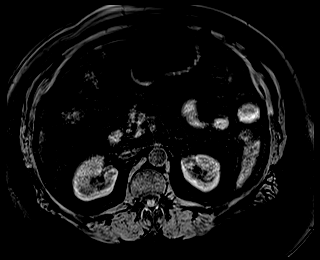
[im 44/88]
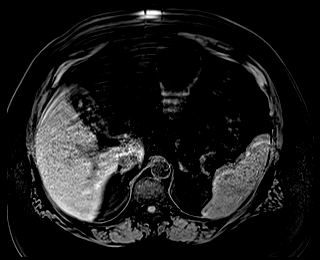
[im 59/88]
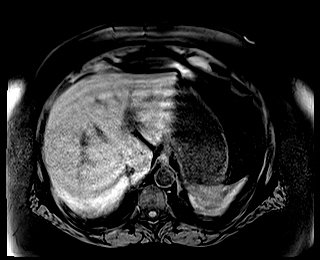
[im 73/88]
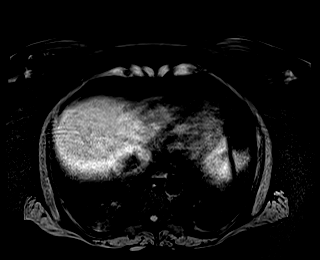
[im 88/88]
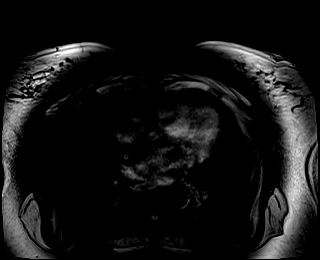

[43 of 48 positions shown; findings below may reference images not displayed]

FINDINGS: Lower chest: Basilar atelectasis noted bilaterally.

Hepatobiliary: No suspicious focal abnormality in the liver on this
study without intravenous contrast. Fine detail of liver parenchyma
obscured by breathing motion. Multiple gallstones are evident
ranging in size from 1-2 mm up to a dominant 17 mm stone. No
evidence for gallbladder wall thickening or pericholecystic fluid.
No intra or extrahepatic biliary duct dilatation with common bile
duct measuring 6 mm diameter in the head of the pancreas. 1-2 mm
stone is identified in the distal common bile duct (well seen on
coronal haste image 16 of series 3 and coronal thin MIP image 5 of
series 15).

Pancreas: No focal mass lesion. No dilatation of the main duct. No
intraparenchymal cyst. No peripancreatic edema.

Spleen:  No splenomegaly. No focal mass lesion.

Adrenals/Urinary Tract: No adrenal nodule or mass. Kidneys
unremarkable.

Stomach/Bowel: Stomach is unremarkable. No gastric wall thickening.
No evidence of outlet obstruction. Duodenum is normally positioned
as is the ligament of Treitz. No small bowel or colonic dilatation
within the visualized abdomen.

Vascular/Lymphatic: No abdominal aortic aneurysm. No abdominal
lymphadenopathy.

Other:  No intraperitoneal free fluid.

Musculoskeletal: No suspicious abnormal marrow signal evident.
IMPRESSION: Cholelithiasis with choledocholithiasis. No intra or extrahepatic
biliary duct dilatation.

## 2023-04-12 ENCOUNTER — Other Ambulatory Visit: Payer: Self-pay

## 2023-04-14 ENCOUNTER — Other Ambulatory Visit: Payer: Self-pay

## 2023-04-14 MED ORDER — HYDROCHLOROTHIAZIDE 25 MG PO TABS
25.0000 mg | ORAL_TABLET | Freq: Every day | ORAL | 3 refills | Status: DC
  Filled 2023-04-14 (×2): qty 30, 30d supply, fill #0
  Filled 2023-05-12 – 2023-05-31 (×2): qty 30, 30d supply, fill #1

## 2023-04-14 MED ORDER — VENLAFAXINE HCL 37.5 MG PO TABS
37.5000 mg | ORAL_TABLET | Freq: Two times a day (BID) | ORAL | 3 refills | Status: DC
  Filled 2023-04-14 (×2): qty 60, 30d supply, fill #0
  Filled 2023-07-24: qty 60, 30d supply, fill #1

## 2023-04-16 ENCOUNTER — Other Ambulatory Visit: Payer: Self-pay

## 2023-04-16 MED ORDER — ESTRADIOL 0.05 MG/24HR TD PTWK
0.0500 mg | MEDICATED_PATCH | TRANSDERMAL | 3 refills | Status: DC
  Filled 2023-04-16: qty 12, 84d supply, fill #0
  Filled 2023-04-19: qty 4, 28d supply, fill #0

## 2023-04-16 MED ORDER — PROGESTERONE 200 MG PO CAPS
200.0000 mg | ORAL_CAPSULE | Freq: Every day | ORAL | 3 refills | Status: AC
  Filled 2023-04-16: qty 36, 84d supply, fill #0

## 2023-04-17 ENCOUNTER — Other Ambulatory Visit: Payer: Self-pay

## 2023-04-17 MED ORDER — PROGESTERONE MICRONIZED 100 MG PO CAPS
100.0000 mg | ORAL_CAPSULE | Freq: Every day | ORAL | 3 refills | Status: AC
  Filled 2023-04-17: qty 36, 84d supply, fill #0

## 2023-04-19 ENCOUNTER — Encounter: Payer: Self-pay | Admitting: Emergency Medicine

## 2023-04-19 ENCOUNTER — Emergency Department
Admission: EM | Admit: 2023-04-19 | Discharge: 2023-04-19 | Disposition: A | Discharge status: Home / Self Care | Payer: Managed Care, Other (non HMO) | Attending: Emergency Medicine | Admitting: Emergency Medicine

## 2023-04-19 ENCOUNTER — Other Ambulatory Visit: Payer: Self-pay

## 2023-04-19 DIAGNOSIS — Z5321 Procedure and treatment not carried out due to patient leaving prior to being seen by health care provider: Secondary | ICD-10-CM | POA: Insufficient documentation

## 2023-04-19 DIAGNOSIS — R4781 Slurred speech: Secondary | ICD-10-CM | POA: Insufficient documentation

## 2023-04-19 DIAGNOSIS — Z8673 Personal history of transient ischemic attack (TIA), and cerebral infarction without residual deficits: Secondary | ICD-10-CM | POA: Insufficient documentation

## 2023-04-19 DIAGNOSIS — R531 Weakness: Secondary | ICD-10-CM | POA: Diagnosis present

## 2023-04-19 LAB — BASIC METABOLIC PANEL
Anion gap: 10 (ref 5–15)
BUN: 16 mg/dL (ref 6–20)
CO2: 24 mmol/L (ref 22–32)
Calcium: 9 mg/dL (ref 8.9–10.3)
Chloride: 104 mmol/L (ref 98–111)
Creatinine, Ser: 0.71 mg/dL (ref 0.44–1.00)
GFR, Estimated: >60 mL/min (ref >60)
Glucose, Bld: 113 mg/dL — ABNORMAL HIGH (ref 70–99)
Potassium: 3.5 mmol/L (ref 3.5–5.1)
Sodium: 138 mmol/L (ref 135–145)

## 2023-04-19 LAB — CBC
HCT: 42.8 % (ref 36.0–46.0)
Hemoglobin: 13.6 g/dL (ref 12.0–15.0)
MCH: 27.1 pg (ref 26.0–34.0)
MCHC: 31.8 g/dL (ref 30.0–36.0)
MCV: 85.3 fL (ref 80.0–100.0)
Platelets: 283 10*3/uL (ref 150–400)
RBC: 5.02 MIL/uL (ref 3.87–5.11)
RDW: 13.3 % (ref 11.5–15.5)
WBC: 7.8 10*3/uL (ref 4.0–10.5)
nRBC: 0 % (ref 0.0–0.2)

## 2023-04-19 NOTE — ED Triage Notes (Signed)
Pt to ED via [facility]. Per pt, she states that she was sent over to evaluate for CVA. Pt states that on Monday, she was disoriented and was off balance when walking into work. Pt states that she thought that this was due to a new medication that she started, pt states that she was also having slurred speech, she was unable to comprehend what people were saying, and that her vision was not right. Pt states that she feels very tired and "heavy" feeling. Pt also states that her heart rate was elevated today. Currently pts Heart rate is WNL.

## 2023-04-19 NOTE — ED Notes (Addendum)
Patient eloped prior to being roomed. NAD noted. Patient left with family.

## 2023-04-21 ENCOUNTER — Other Ambulatory Visit: Payer: Self-pay

## 2023-04-21 MED ORDER — ASPIRIN 81 MG PO TBEC
81.0000 mg | DELAYED_RELEASE_TABLET | Freq: Every day | ORAL | 11 refills | Status: AC
  Filled 2023-04-21: qty 30, 30d supply, fill #0
  Filled 2023-07-24: qty 30, 30d supply, fill #1

## 2023-04-21 MED ORDER — DIAZEPAM 2 MG PO TABS
2.0000 mg | ORAL_TABLET | ORAL | 0 refills | Status: DC
  Filled 2023-04-21: qty 2, 1d supply, fill #0

## 2023-04-26 ENCOUNTER — Other Ambulatory Visit: Payer: Self-pay

## 2023-04-26 ENCOUNTER — Other Ambulatory Visit: Payer: Self-pay | Admitting: Student

## 2023-04-26 DIAGNOSIS — R404 Transient alteration of awareness: Secondary | ICD-10-CM

## 2023-04-26 DIAGNOSIS — R29898 Other symptoms and signs involving the musculoskeletal system: Secondary | ICD-10-CM

## 2023-04-26 DIAGNOSIS — R519 Headache, unspecified: Secondary | ICD-10-CM

## 2023-04-26 MED ORDER — NORTRIPTYLINE HCL 10 MG PO CAPS
ORAL_CAPSULE | ORAL | 3 refills | Status: DC
  Filled 2023-04-26: qty 60, 33d supply, fill #0

## 2023-05-03 ENCOUNTER — Encounter: Payer: Self-pay | Admitting: Student

## 2023-05-05 ENCOUNTER — Other Ambulatory Visit: Payer: Self-pay

## 2023-05-05 MED ORDER — BACLOFEN 10 MG PO TABS
10.0000 mg | ORAL_TABLET | Freq: Two times a day (BID) | ORAL | 0 refills | Status: AC | PRN
  Filled 2023-05-05: qty 60, 30d supply, fill #0

## 2023-05-11 ENCOUNTER — Ambulatory Visit
Admission: RE | Admit: 2023-05-11 | Discharge: 2023-05-11 | Disposition: A | Discharge status: Home / Self Care | Payer: Managed Care, Other (non HMO) | Source: Ambulatory Visit | Attending: Student | Admitting: Student

## 2023-05-11 DIAGNOSIS — R404 Transient alteration of awareness: Secondary | ICD-10-CM

## 2023-05-11 DIAGNOSIS — R519 Headache, unspecified: Secondary | ICD-10-CM

## 2023-05-11 DIAGNOSIS — R29898 Other symptoms and signs involving the musculoskeletal system: Secondary | ICD-10-CM

## 2023-05-19 ENCOUNTER — Other Ambulatory Visit: Payer: Self-pay

## 2023-05-31 ENCOUNTER — Other Ambulatory Visit: Payer: Self-pay

## 2023-05-31 MED ORDER — PHENTERMINE HCL 37.5 MG PO TABS
37.5000 mg | ORAL_TABLET | Freq: Every morning | ORAL | 0 refills | Status: DC
  Filled 2023-05-31: qty 30, 30d supply, fill #0

## 2023-06-01 ENCOUNTER — Other Ambulatory Visit: Payer: Self-pay

## 2023-06-26 ENCOUNTER — Other Ambulatory Visit: Payer: Self-pay

## 2023-06-26 MED ORDER — NA SULFATE-K SULFATE-MG SULF 17.5-3.13-1.6 GM/177ML PO SOLN
1.0000 | ORAL | 0 refills | Status: DC
  Filled 2023-06-26: qty 354, 1d supply, fill #0

## 2023-06-26 MED ORDER — PANTOPRAZOLE SODIUM 20 MG PO TBEC
20.0000 mg | DELAYED_RELEASE_TABLET | Freq: Every day | ORAL | 11 refills | Status: AC
  Filled 2023-06-26: qty 30, 30d supply, fill #0
  Filled 2023-07-24: qty 30, 30d supply, fill #1
  Filled 2023-10-06: qty 30, 30d supply, fill #2
  Filled 2023-11-02: qty 30, 30d supply, fill #3

## 2023-06-29 ENCOUNTER — Other Ambulatory Visit: Payer: Self-pay

## 2023-06-29 MED ORDER — POTASSIUM CHLORIDE ER 10 MEQ PO TBCR
20.0000 meq | EXTENDED_RELEASE_TABLET | Freq: Two times a day (BID) | ORAL | 0 refills | Status: AC
  Filled 2023-06-29: qty 8, 2d supply, fill #0

## 2023-06-29 MED ORDER — CIPROFLOXACIN HCL 500 MG PO TABS
500.0000 mg | ORAL_TABLET | Freq: Two times a day (BID) | ORAL | 0 refills | Status: AC
  Filled 2023-06-29: qty 14, 7d supply, fill #0

## 2023-07-03 ENCOUNTER — Other Ambulatory Visit: Payer: Self-pay

## 2023-07-03 MED ORDER — VALACYCLOVIR HCL 500 MG PO TABS
500.0000 mg | ORAL_TABLET | Freq: Two times a day (BID) | ORAL | 5 refills | Status: AC
  Filled 2023-07-03: qty 12, 6d supply, fill #0
  Filled 2023-09-25: qty 12, 6d supply, fill #1
  Filled 2024-01-15: qty 12, 6d supply, fill #2
  Filled 2024-02-27: qty 12, 6d supply, fill #3
  Filled 2024-05-09: qty 12, 6d supply, fill #4
  Filled 2024-06-06: qty 12, 6d supply, fill #5

## 2023-07-03 MED ORDER — POTASSIUM CHLORIDE ER 10 MEQ PO TBCR
EXTENDED_RELEASE_TABLET | ORAL | 0 refills | Status: DC
  Filled 2023-07-03: qty 4, 2d supply, fill #0

## 2023-07-03 MED ORDER — ONDANSETRON 4 MG PO TBDP
4.0000 mg | ORAL_TABLET | Freq: Three times a day (TID) | ORAL | 0 refills | Status: AC | PRN
  Filled 2023-07-03: qty 20, 7d supply, fill #0

## 2023-07-04 ENCOUNTER — Other Ambulatory Visit: Payer: Self-pay

## 2023-07-04 MED ORDER — NYSTATIN 100000 UNIT/ML MT SUSP
OROMUCOSAL | 0 refills | Status: AC
  Filled 2023-07-04: qty 200, 10d supply, fill #0

## 2023-07-12 ENCOUNTER — Other Ambulatory Visit: Payer: Self-pay

## 2023-07-18 ENCOUNTER — Other Ambulatory Visit: Payer: Self-pay

## 2023-07-18 MED ORDER — HYDROCORTISONE 2.5 % EX CREA
TOPICAL_CREAM | Freq: Two times a day (BID) | CUTANEOUS | 0 refills | Status: DC
  Filled 2023-07-18: qty 30, 30d supply, fill #0

## 2023-07-24 ENCOUNTER — Other Ambulatory Visit: Payer: Self-pay

## 2023-07-25 ENCOUNTER — Other Ambulatory Visit: Payer: Self-pay

## 2023-07-27 ENCOUNTER — Other Ambulatory Visit: Payer: Self-pay

## 2023-07-28 ENCOUNTER — Other Ambulatory Visit: Payer: Self-pay

## 2023-08-02 ENCOUNTER — Other Ambulatory Visit: Payer: Self-pay

## 2023-08-02 MED ORDER — PHENTERMINE HCL 37.5 MG PO TABS
37.5000 mg | ORAL_TABLET | Freq: Every day | ORAL | 0 refills | Status: DC
  Filled 2023-08-02: qty 30, 30d supply, fill #0

## 2023-08-15 ENCOUNTER — Other Ambulatory Visit: Payer: Self-pay

## 2023-08-17 ENCOUNTER — Other Ambulatory Visit: Payer: Self-pay

## 2023-08-17 MED ORDER — FLUCONAZOLE 150 MG PO TABS
150.0000 mg | ORAL_TABLET | Freq: Once | ORAL | 0 refills | Status: AC
  Filled 2023-08-17: qty 1, 1d supply, fill #0

## 2023-08-22 ENCOUNTER — Other Ambulatory Visit: Payer: Self-pay

## 2023-08-22 MED ORDER — HYDROCHLOROTHIAZIDE 12.5 MG PO TABS
12.5000 mg | ORAL_TABLET | Freq: Every day | ORAL | 3 refills | Status: DC
  Filled 2023-08-22: qty 30, 30d supply, fill #0
  Filled 2023-09-25 – 2024-02-02 (×2): qty 30, 30d supply, fill #1
  Filled 2024-02-27: qty 30, 30d supply, fill #2
  Filled 2024-05-09: qty 30, 30d supply, fill #3

## 2023-09-13 ENCOUNTER — Other Ambulatory Visit: Payer: Self-pay

## 2023-09-13 MED ORDER — PHENTERMINE HCL 37.5 MG PO TABS
37.5000 mg | ORAL_TABLET | Freq: Every day | ORAL | 0 refills | Status: DC
  Filled 2023-09-13: qty 30, 30d supply, fill #0

## 2023-09-19 ENCOUNTER — Other Ambulatory Visit: Payer: Self-pay

## 2023-09-19 MED ORDER — ESCITALOPRAM OXALATE 5 MG PO TABS
5.0000 mg | ORAL_TABLET | Freq: Every day | ORAL | 2 refills | Status: DC
  Filled 2023-09-19: qty 30, 30d supply, fill #0

## 2023-09-25 ENCOUNTER — Other Ambulatory Visit: Payer: Self-pay

## 2023-09-27 ENCOUNTER — Other Ambulatory Visit: Payer: Self-pay

## 2023-09-28 ENCOUNTER — Other Ambulatory Visit: Payer: Self-pay

## 2023-09-28 MED ORDER — HYDROCHLOROTHIAZIDE 12.5 MG PO TABS
12.5000 mg | ORAL_TABLET | Freq: Every day | ORAL | 3 refills | Status: AC
Start: 2023-09-28 — End: ?
  Filled 2023-09-28 (×2): qty 30, 30d supply, fill #0
  Filled 2023-11-02: qty 30, 30d supply, fill #1
  Filled 2023-12-21: qty 30, 30d supply, fill #2
  Filled 2024-04-11: qty 30, 30d supply, fill #3

## 2023-10-02 ENCOUNTER — Other Ambulatory Visit: Payer: Self-pay

## 2023-10-06 ENCOUNTER — Other Ambulatory Visit: Payer: Self-pay

## 2023-10-10 ENCOUNTER — Other Ambulatory Visit: Payer: Self-pay

## 2023-10-10 MED ORDER — SULFAMETHOXAZOLE-TRIMETHOPRIM 800-160 MG PO TABS
1.0000 | ORAL_TABLET | Freq: Two times a day (BID) | ORAL | 0 refills | Status: AC
  Filled 2023-10-10: qty 10, 5d supply, fill #0

## 2023-10-13 ENCOUNTER — Other Ambulatory Visit: Payer: Self-pay

## 2023-10-13 ENCOUNTER — Other Ambulatory Visit: Payer: Self-pay | Admitting: Obstetrics and Gynecology

## 2023-10-13 DIAGNOSIS — Z1231 Encounter for screening mammogram for malignant neoplasm of breast: Secondary | ICD-10-CM

## 2023-10-13 MED ORDER — GABAPENTIN 100 MG PO CAPS
100.0000 mg | ORAL_CAPSULE | Freq: Three times a day (TID) | ORAL | 11 refills | Status: AC
  Filled 2023-10-13: qty 90, 30d supply, fill #0
  Filled 2023-12-07: qty 90, 30d supply, fill #1
  Filled 2024-01-08: qty 90, 30d supply, fill #2
  Filled 2024-02-02: qty 90, 30d supply, fill #3

## 2023-10-13 MED ORDER — POTASSIUM CHLORIDE CRYS ER 10 MEQ PO TBCR
10.0000 meq | EXTENDED_RELEASE_TABLET | Freq: Two times a day (BID) | ORAL | 0 refills | Status: DC
  Filled 2023-10-13: qty 10, 5d supply, fill #0

## 2023-10-13 MED ORDER — MINOXIDIL 2.5 MG PO TABS
2.5000 mg | ORAL_TABLET | Freq: Every day | ORAL | 11 refills | Status: DC
  Filled 2023-10-13: qty 30, 30d supply, fill #0
  Filled 2023-12-07: qty 30, 30d supply, fill #1
  Filled 2024-01-15: qty 30, 30d supply, fill #2
  Filled 2024-02-27: qty 30, 30d supply, fill #3
  Filled 2024-04-11: qty 30, 30d supply, fill #4
  Filled 2024-05-09: qty 30, 30d supply, fill #5
  Filled 2024-06-08: qty 30, 30d supply, fill #6
  Filled 2024-08-01 – 2024-09-18 (×2): qty 30, 30d supply, fill #7

## 2023-11-03 ENCOUNTER — Other Ambulatory Visit: Payer: Self-pay

## 2023-11-06 ENCOUNTER — Other Ambulatory Visit: Payer: Self-pay

## 2023-11-06 MED ORDER — DIETHYLPROPION HCL ER 75 MG PO TB24
75.0000 mg | ORAL_TABLET | Freq: Every day | ORAL | 3 refills | Status: AC
  Filled 2023-11-06: qty 30, 30d supply, fill #0
  Filled 2023-12-21 – 2023-12-27 (×2): qty 30, 30d supply, fill #1

## 2023-11-07 ENCOUNTER — Other Ambulatory Visit: Payer: Self-pay

## 2023-12-05 ENCOUNTER — Other Ambulatory Visit: Payer: Self-pay

## 2023-12-05 MED ORDER — AMOXICILLIN-POT CLAVULANATE 875-125 MG PO TABS
875.0000 mg | ORAL_TABLET | Freq: Two times a day (BID) | ORAL | 0 refills | Status: AC
  Filled 2023-12-05: qty 10, 5d supply, fill #0

## 2023-12-07 ENCOUNTER — Other Ambulatory Visit: Payer: Self-pay

## 2023-12-08 ENCOUNTER — Other Ambulatory Visit: Payer: Self-pay

## 2023-12-11 ENCOUNTER — Other Ambulatory Visit: Payer: Self-pay

## 2023-12-11 MED ORDER — CEFUROXIME AXETIL 500 MG PO TABS
500.0000 mg | ORAL_TABLET | Freq: Two times a day (BID) | ORAL | 0 refills | Status: AC
  Filled 2023-12-11: qty 20, 10d supply, fill #0

## 2023-12-11 MED ORDER — HYDROCOD POLI-CHLORPHE POLI ER 10-8 MG/5ML PO SUER
5.0000 mL | Freq: Two times a day (BID) | ORAL | 0 refills | Status: AC | PRN
  Filled 2023-12-11: qty 115, 12d supply, fill #0

## 2023-12-21 ENCOUNTER — Other Ambulatory Visit: Payer: Self-pay

## 2023-12-27 ENCOUNTER — Other Ambulatory Visit: Payer: Self-pay

## 2023-12-28 ENCOUNTER — Other Ambulatory Visit: Payer: Self-pay

## 2023-12-28 MED ORDER — DIETHYLPROPION HCL ER 75 MG PO TB24
75.0000 mg | ORAL_TABLET | Freq: Every day | ORAL | 0 refills | Status: AC
  Filled 2023-12-28: qty 30, 30d supply, fill #0

## 2024-03-14 ENCOUNTER — Other Ambulatory Visit: Payer: Self-pay

## 2024-03-14 MED ORDER — PHENTERMINE HCL 37.5 MG PO TABS
37.5000 mg | ORAL_TABLET | Freq: Every morning | ORAL | 0 refills | Status: DC
  Filled 2024-03-14: qty 30, 30d supply, fill #0

## 2024-04-11 ENCOUNTER — Other Ambulatory Visit: Payer: Self-pay

## 2024-04-17 ENCOUNTER — Other Ambulatory Visit: Payer: Self-pay

## 2024-04-17 MED ORDER — ESTRADIOL 0.05 MG/24HR TD PTWK
MEDICATED_PATCH | TRANSDERMAL | 0 refills | Status: DC
Start: 2024-04-17 — End: 2024-10-15
  Filled 2024-04-17: qty 4, 28d supply, fill #0

## 2024-04-18 ENCOUNTER — Other Ambulatory Visit: Payer: Self-pay

## 2024-04-18 MED ORDER — PHENTERMINE HCL 37.5 MG PO TABS
37.5000 mg | ORAL_TABLET | Freq: Every morning | ORAL | 0 refills | Status: DC
  Filled 2024-04-18: qty 30, 30d supply, fill #0

## 2024-05-22 ENCOUNTER — Other Ambulatory Visit: Payer: Self-pay

## 2024-05-22 MED ORDER — PHENTERMINE HCL 37.5 MG PO TABS
37.5000 mg | ORAL_TABLET | ORAL | 0 refills | Status: DC
  Filled 2024-05-22: qty 30, 30d supply, fill #0

## 2024-06-06 ENCOUNTER — Other Ambulatory Visit: Payer: Self-pay

## 2024-06-07 ENCOUNTER — Other Ambulatory Visit: Payer: Self-pay

## 2024-06-10 ENCOUNTER — Other Ambulatory Visit: Payer: Self-pay

## 2024-06-28 ENCOUNTER — Other Ambulatory Visit: Payer: Self-pay

## 2024-06-28 MED ORDER — BUPROPION HCL ER (XL) 150 MG PO TB24
150.0000 mg | ORAL_TABLET | Freq: Every day | ORAL | 11 refills | Status: AC
  Filled 2024-06-28: qty 30, 30d supply, fill #0
  Filled 2024-08-01 – 2024-08-15 (×2): qty 90, 90d supply, fill #1

## 2024-07-05 ENCOUNTER — Other Ambulatory Visit: Payer: Self-pay

## 2024-07-05 MED ORDER — HYDROCHLOROTHIAZIDE 12.5 MG PO TABS
12.5000 mg | ORAL_TABLET | Freq: Every day | ORAL | 3 refills | Status: AC
  Filled 2024-07-05: qty 30, 30d supply, fill #0
  Filled 2024-08-15: qty 30, 30d supply, fill #1
  Filled 2024-09-18: qty 30, 30d supply, fill #2
  Filled 2024-11-18: qty 30, 30d supply, fill #3

## 2024-07-08 ENCOUNTER — Other Ambulatory Visit: Payer: Self-pay

## 2024-07-09 ENCOUNTER — Other Ambulatory Visit: Payer: Self-pay

## 2024-07-09 MED ORDER — PHENTERMINE HCL 37.5 MG PO TABS
37.5000 mg | ORAL_TABLET | Freq: Every day | ORAL | 0 refills | Status: AC
  Filled 2024-07-09: qty 30, 30d supply, fill #0

## 2024-08-01 ENCOUNTER — Other Ambulatory Visit: Payer: Self-pay

## 2024-08-13 ENCOUNTER — Other Ambulatory Visit: Payer: Self-pay

## 2024-08-14 ENCOUNTER — Other Ambulatory Visit: Payer: Self-pay

## 2024-08-15 ENCOUNTER — Other Ambulatory Visit: Payer: Self-pay

## 2024-09-04 ENCOUNTER — Other Ambulatory Visit: Payer: Self-pay

## 2024-09-05 ENCOUNTER — Other Ambulatory Visit: Payer: Self-pay

## 2024-09-05 MED ORDER — PHENTERMINE-TOPIRAMATE ER 3.75-23 MG PO CP24
1.0000 | ORAL_CAPSULE | Freq: Every morning | ORAL | 0 refills | Status: DC
  Filled 2024-09-05: qty 14, 14d supply, fill #0

## 2024-09-06 ENCOUNTER — Other Ambulatory Visit: Payer: Self-pay

## 2024-09-06 MED ORDER — TOPIRAMATE 25 MG PO TABS
25.0000 mg | ORAL_TABLET | Freq: Every day | ORAL | 0 refills | Status: DC
  Filled 2024-09-06: qty 14, 14d supply, fill #0

## 2024-09-06 MED ORDER — PHENTERMINE HCL 8 MG PO TABS
4.0000 mg | ORAL_TABLET | Freq: Every day | ORAL | 0 refills | Status: AC
  Filled 2024-09-06: qty 14, 28d supply, fill #0

## 2024-09-11 ENCOUNTER — Other Ambulatory Visit: Payer: Self-pay

## 2024-09-11 MED ORDER — METHOCARBAMOL 750 MG PO TABS
750.0000 mg | ORAL_TABLET | Freq: Three times a day (TID) | ORAL | 0 refills | Status: AC | PRN
  Filled 2024-09-11: qty 30, 10d supply, fill #0

## 2024-09-17 ENCOUNTER — Other Ambulatory Visit: Payer: Self-pay

## 2024-09-17 MED ORDER — TRAZODONE HCL 50 MG PO TABS
50.0000 mg | ORAL_TABLET | Freq: Every day | ORAL | 11 refills | Status: AC
  Filled 2024-09-17: qty 30, 30d supply, fill #0
  Filled 2024-10-19: qty 30, 30d supply, fill #1
  Filled 2024-11-18: qty 30, 30d supply, fill #2
  Filled 2024-12-18: qty 30, 30d supply, fill #3

## 2024-09-22 ENCOUNTER — Other Ambulatory Visit: Payer: Self-pay

## 2024-09-23 ENCOUNTER — Other Ambulatory Visit: Payer: Self-pay

## 2024-09-24 ENCOUNTER — Other Ambulatory Visit: Payer: Self-pay

## 2024-09-24 MED ORDER — METHOCARBAMOL 500 MG PO TABS
500.0000 mg | ORAL_TABLET | Freq: Four times a day (QID) | ORAL | 0 refills | Status: DC
  Filled 2024-09-24: qty 40, 10d supply, fill #0

## 2024-10-03 ENCOUNTER — Other Ambulatory Visit: Payer: Self-pay

## 2024-10-03 MED ORDER — METHOCARBAMOL 500 MG PO TABS
500.0000 mg | ORAL_TABLET | Freq: Four times a day (QID) | ORAL | 0 refills | Status: DC
  Filled 2024-10-03: qty 60, 15d supply, fill #0

## 2024-10-03 MED ORDER — MELOXICAM 15 MG PO TABS
15.0000 mg | ORAL_TABLET | Freq: Every day | ORAL | 0 refills | Status: DC
  Filled 2024-10-03: qty 30, 30d supply, fill #0

## 2024-10-15 ENCOUNTER — Other Ambulatory Visit: Payer: Self-pay

## 2024-10-15 MED ORDER — PHENTERMINE HCL 37.5 MG PO TABS
37.5000 mg | ORAL_TABLET | Freq: Every morning | ORAL | 2 refills | Status: AC
  Filled 2024-10-15: qty 30, 30d supply, fill #0
  Filled 2024-11-30: qty 30, 30d supply, fill #1

## 2024-10-16 ENCOUNTER — Other Ambulatory Visit: Payer: Self-pay

## 2024-10-18 ENCOUNTER — Other Ambulatory Visit: Payer: Self-pay

## 2024-10-18 MED ORDER — MELOXICAM 15 MG PO TABS
15.0000 mg | ORAL_TABLET | Freq: Every day | ORAL | 0 refills | Status: DC
  Filled 2024-10-18: qty 30, 30d supply, fill #0

## 2024-10-19 ENCOUNTER — Other Ambulatory Visit: Payer: Self-pay

## 2024-10-21 ENCOUNTER — Other Ambulatory Visit: Payer: Self-pay

## 2024-10-21 MED ORDER — METHOCARBAMOL 500 MG PO TABS
500.0000 mg | ORAL_TABLET | Freq: Four times a day (QID) | ORAL | 0 refills | Status: DC
  Filled 2024-10-21: qty 60, 15d supply, fill #0

## 2024-11-13 ENCOUNTER — Other Ambulatory Visit: Payer: Self-pay

## 2024-11-13 MED ORDER — MELOXICAM 15 MG PO TABS
15.0000 mg | ORAL_TABLET | Freq: Every day | ORAL | 0 refills | Status: AC
  Filled 2024-11-13 (×2): qty 30, 30d supply, fill #0

## 2024-11-13 MED ORDER — METHOCARBAMOL 500 MG PO TABS
500.0000 mg | ORAL_TABLET | Freq: Four times a day (QID) | ORAL | 0 refills | Status: DC
  Filled 2024-11-13: qty 60, 15d supply, fill #0

## 2024-11-18 ENCOUNTER — Other Ambulatory Visit: Payer: Self-pay

## 2024-11-19 ENCOUNTER — Other Ambulatory Visit: Payer: Self-pay

## 2024-11-20 ENCOUNTER — Other Ambulatory Visit: Payer: Self-pay

## 2024-11-20 MED ORDER — MINOXIDIL 2.5 MG PO TABS
2.5000 mg | ORAL_TABLET | Freq: Every day | ORAL | 0 refills | Status: DC
  Filled 2024-11-20: qty 30, 30d supply, fill #0

## 2024-11-30 ENCOUNTER — Other Ambulatory Visit: Payer: Self-pay

## 2024-12-02 ENCOUNTER — Other Ambulatory Visit: Payer: Self-pay

## 2024-12-02 ENCOUNTER — Other Ambulatory Visit: Payer: Self-pay | Admitting: Orthopedic Surgery

## 2024-12-02 DIAGNOSIS — M5412 Radiculopathy, cervical region: Secondary | ICD-10-CM

## 2024-12-02 DIAGNOSIS — S161XXA Strain of muscle, fascia and tendon at neck level, initial encounter: Secondary | ICD-10-CM

## 2024-12-03 ENCOUNTER — Other Ambulatory Visit: Payer: Self-pay | Admitting: Orthopedic Surgery

## 2024-12-03 ENCOUNTER — Other Ambulatory Visit: Payer: Self-pay

## 2024-12-03 DIAGNOSIS — M75102 Unspecified rotator cuff tear or rupture of left shoulder, not specified as traumatic: Secondary | ICD-10-CM

## 2024-12-03 MED ORDER — DIAZEPAM 2 MG PO TABS
2.0000 mg | ORAL_TABLET | ORAL | 0 refills | Status: AC
  Filled 2024-12-03: qty 2, 1d supply, fill #0

## 2024-12-04 ENCOUNTER — Other Ambulatory Visit: Payer: Self-pay

## 2024-12-04 MED ORDER — PHENTERMINE HCL 37.5 MG PO TABS
37.5000 mg | ORAL_TABLET | Freq: Every day | ORAL | 2 refills | Status: AC
  Filled 2025-01-10: qty 30, 30d supply, fill #0

## 2024-12-06 ENCOUNTER — Ambulatory Visit
Admission: RE | Admit: 2024-12-06 | Discharge: 2024-12-06 | Disposition: A | Discharge status: Home / Self Care | Source: Ambulatory Visit | Attending: Orthopedic Surgery | Admitting: Orthopedic Surgery

## 2024-12-06 DIAGNOSIS — M5412 Radiculopathy, cervical region: Secondary | ICD-10-CM | POA: Insufficient documentation

## 2024-12-06 DIAGNOSIS — S161XXA Strain of muscle, fascia and tendon at neck level, initial encounter: Secondary | ICD-10-CM | POA: Insufficient documentation

## 2024-12-06 DIAGNOSIS — M75102 Unspecified rotator cuff tear or rupture of left shoulder, not specified as traumatic: Secondary | ICD-10-CM | POA: Insufficient documentation

## 2024-12-11 ENCOUNTER — Other Ambulatory Visit: Payer: Self-pay

## 2024-12-15 ENCOUNTER — Other Ambulatory Visit: Payer: Self-pay

## 2024-12-16 ENCOUNTER — Other Ambulatory Visit: Payer: Self-pay

## 2024-12-16 MED ORDER — METHOCARBAMOL 500 MG PO TABS
500.0000 mg | ORAL_TABLET | Freq: Four times a day (QID) | ORAL | 0 refills | Status: AC
  Filled 2024-12-16: qty 60, 15d supply, fill #0

## 2024-12-17 ENCOUNTER — Other Ambulatory Visit: Payer: Self-pay

## 2024-12-18 ENCOUNTER — Other Ambulatory Visit: Payer: Self-pay

## 2024-12-19 ENCOUNTER — Other Ambulatory Visit: Payer: Self-pay

## 2024-12-23 ENCOUNTER — Other Ambulatory Visit: Payer: Self-pay

## 2024-12-24 ENCOUNTER — Other Ambulatory Visit: Payer: Self-pay

## 2024-12-26 ENCOUNTER — Other Ambulatory Visit: Payer: Self-pay

## 2024-12-27 ENCOUNTER — Other Ambulatory Visit: Payer: Self-pay

## 2024-12-27 MED ORDER — TRAZODONE HCL 50 MG PO TABS
50.0000 mg | ORAL_TABLET | Freq: Every day | ORAL | 11 refills | Status: AC
  Filled 2024-12-27: qty 30, 30d supply, fill #0

## 2025-01-09 ENCOUNTER — Other Ambulatory Visit: Payer: Self-pay

## 2025-01-09 MED ORDER — TRAZODONE HCL 50 MG PO TABS
50.0000 mg | ORAL_TABLET | Freq: Every day | ORAL | 11 refills | Status: AC
  Filled 2025-01-09: qty 30, 30d supply, fill #0

## 2025-01-10 ENCOUNTER — Other Ambulatory Visit: Payer: Self-pay

## 2025-01-10 MED ORDER — MINOXIDIL 2.5 MG PO TABS
2.5000 mg | ORAL_TABLET | Freq: Every day | ORAL | 0 refills | Status: AC
  Filled 2025-01-10: qty 30, 30d supply, fill #0

## 2025-01-10 MED ORDER — NYSTATIN 100000 UNIT/ML MT SUSP
5.0000 mL | Freq: Four times a day (QID) | OROMUCOSAL | 0 refills | Status: AC
  Filled 2025-01-10: qty 200, 10d supply, fill #0

## 2025-01-10 MED ORDER — FLUCONAZOLE 150 MG PO TABS
150.0000 mg | ORAL_TABLET | ORAL | 0 refills | Status: AC | PRN
  Filled 2025-01-10: qty 2, 2d supply, fill #0
